# Patient Record
Sex: Male | Born: 1980
Health system: Southern US, Community
[De-identification: ages and names within clinical notes are randomized; demographics above are authoritative.]

## PROBLEM LIST (undated history)

## (undated) DIAGNOSIS — B20 Human immunodeficiency virus [HIV] disease: Secondary | ICD-10-CM

## (undated) DIAGNOSIS — T7840XA Allergy, unspecified, initial encounter: Secondary | ICD-10-CM

## (undated) HISTORY — DX: Allergy, unspecified, initial encounter: T78.40XA

## (undated) HISTORY — DX: Human immunodeficiency virus (HIV) disease: B20

---

## 2005-03-18 ENCOUNTER — Emergency Department (HOSPITAL_COMMUNITY): Admission: EM | Admit: 2005-03-18 | Discharge: 2005-03-18 | Payer: Self-pay | Admitting: Family Medicine

## 2006-02-06 ENCOUNTER — Ambulatory Visit: Payer: Self-pay | Admitting: Cardiology

## 2006-02-06 ENCOUNTER — Ambulatory Visit: Payer: Self-pay | Admitting: Internal Medicine

## 2006-02-26 ENCOUNTER — Ambulatory Visit: Payer: Self-pay | Admitting: Internal Medicine

## 2007-03-01 IMAGING — CT CT PELVIS W/ CM
2 of 5 series · 17 of 46 positions shown, 19 images · IV contrast (omnipaque)
Comparison: none

CLINICAL DATA: Abdominal pain, particularly left-sided, with some nausea and diarrhea.
 ABDOMEN CT WITH CONTRAST ? 02/06/06:
TECHNIQUE: Multidetector CT imaging of the abdomen was performed following the standard protocol during bolus administration of intravenous contrast. 
 Contrast:  125 cc Omnipaque 300 IV.   Oral contrast was given.
TECHNIQUE: Multidetector CT imaging of the pelvis was performed following the standard protocol during bolus administration of intravenous contrast.

[Series 2: abd_pel_xxl 5.0 b10f st · axial · 0.75mm/px · z∈[-440,-75]mm · 14 of 83 slices shown, 16 images]
[im 5/83  soft-tissue]
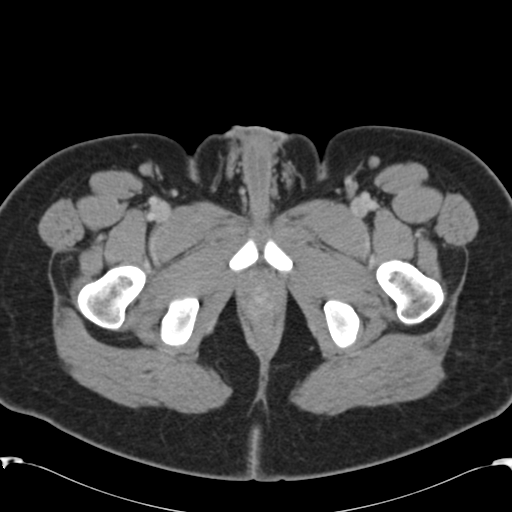
[im 5/83  bone]
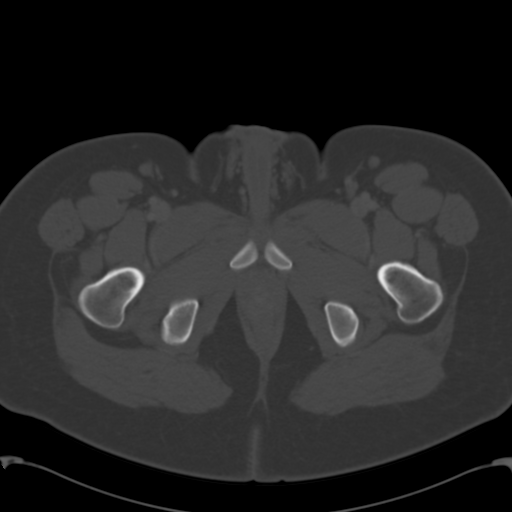
[im 9/83  soft-tissue]
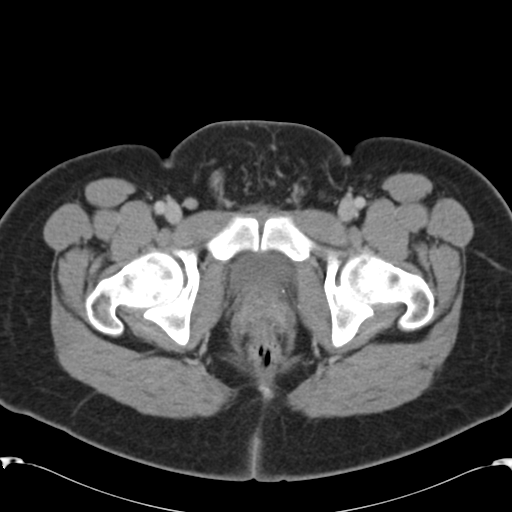
[im 18/83  soft-tissue]
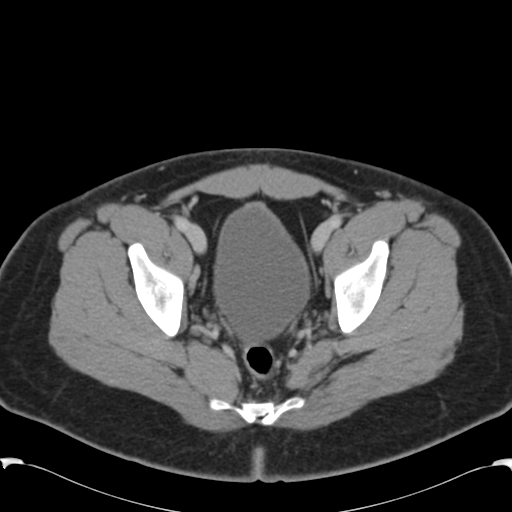
[im 22/83  soft-tissue]
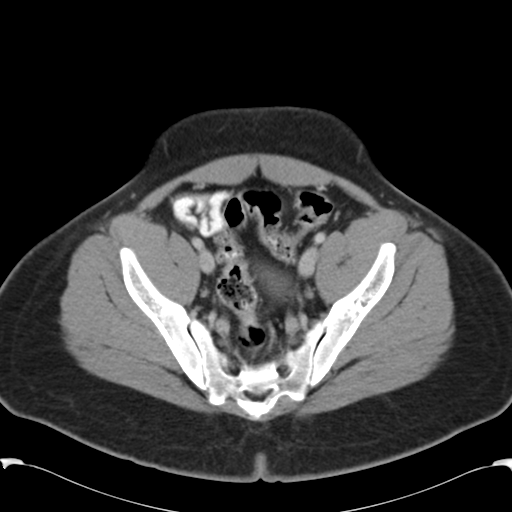
[im 26/83  soft-tissue]
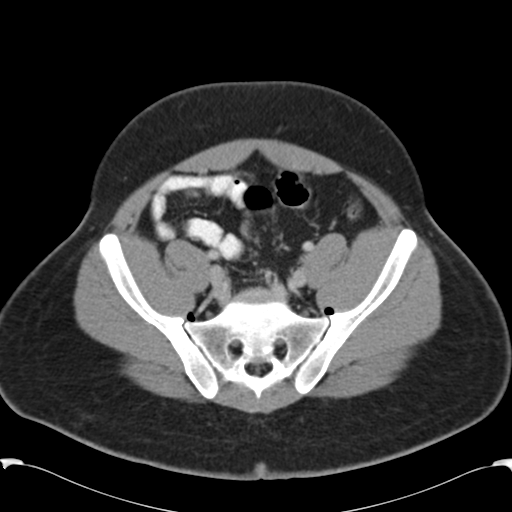
[im 35/83  soft-tissue]
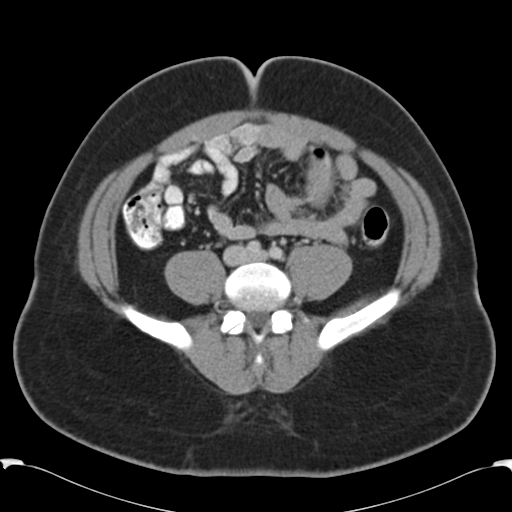
[im 39/83  soft-tissue]
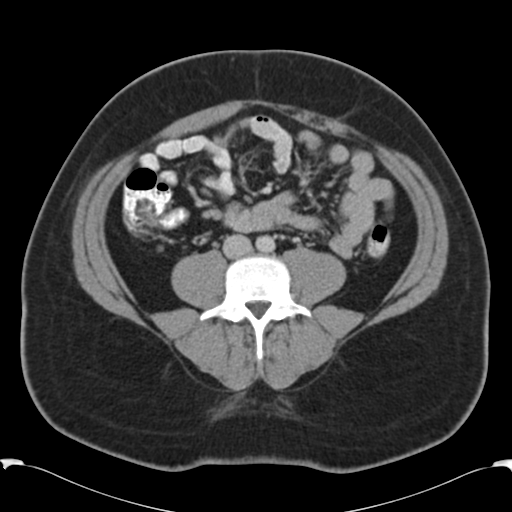
[im 44/83  soft-tissue]
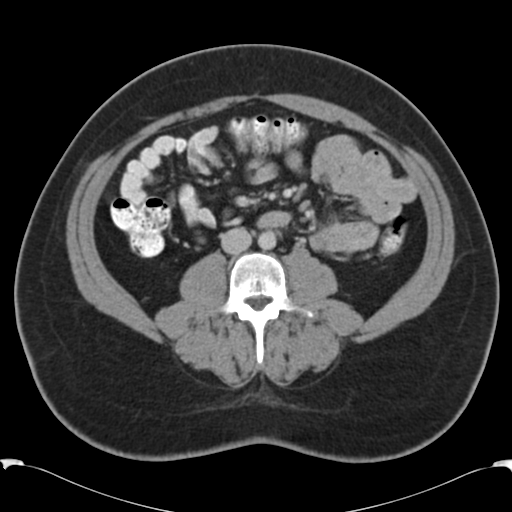
[im 48/83  soft-tissue]
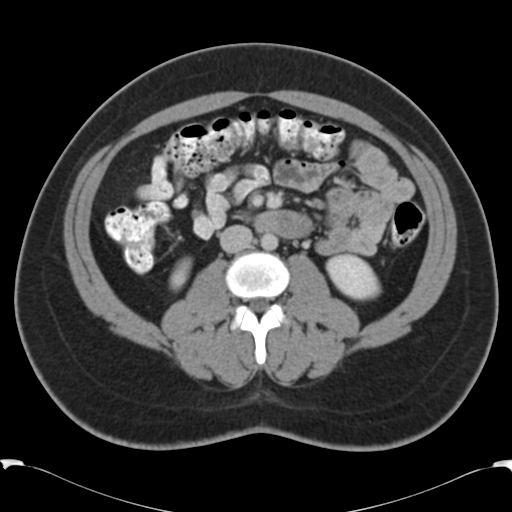
[im 48/83  bone]
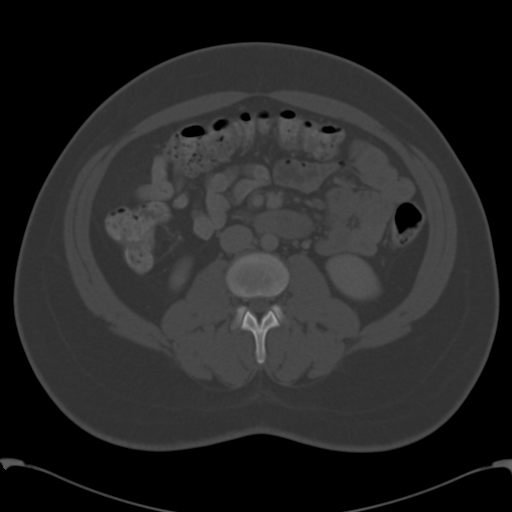
[im 57/83  soft-tissue]
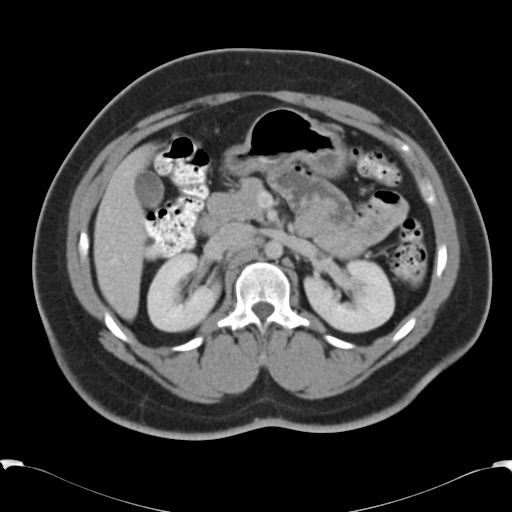
[im 61/83  soft-tissue]
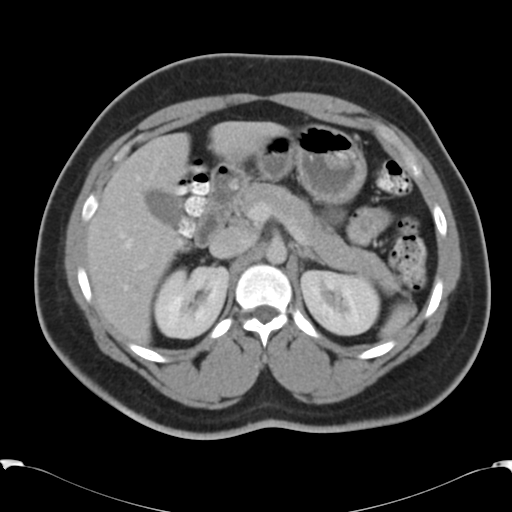
[im 65/83  soft-tissue]
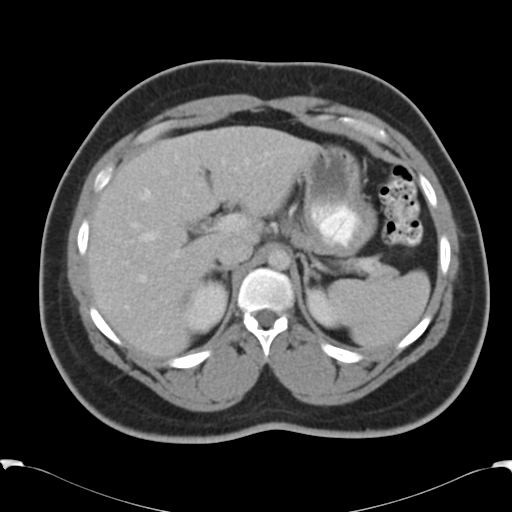
[im 74/83  soft-tissue]
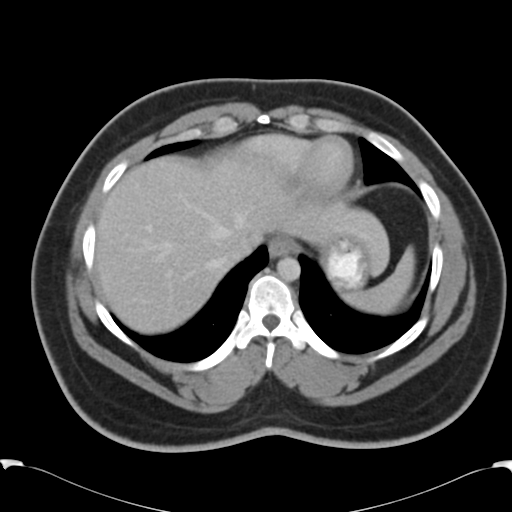
[im 78/83  soft-tissue]
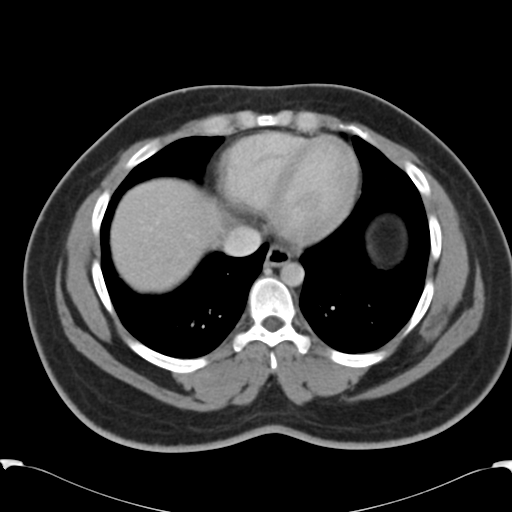

[Series 602: <mpr range> · coronal · 0.83mm/px · 3 of 39 slices shown]
[im 13/39  soft-tissue]
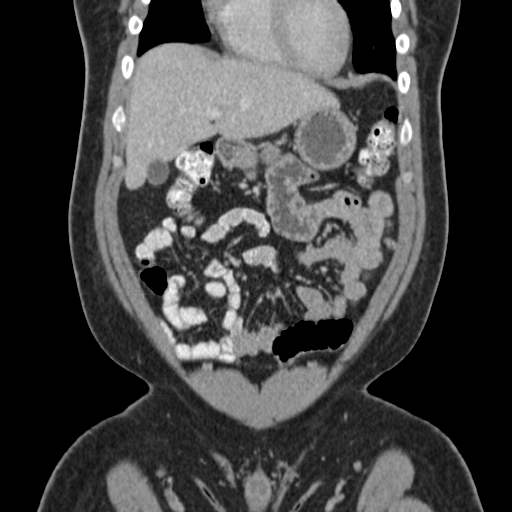
[im 17/39  soft-tissue]
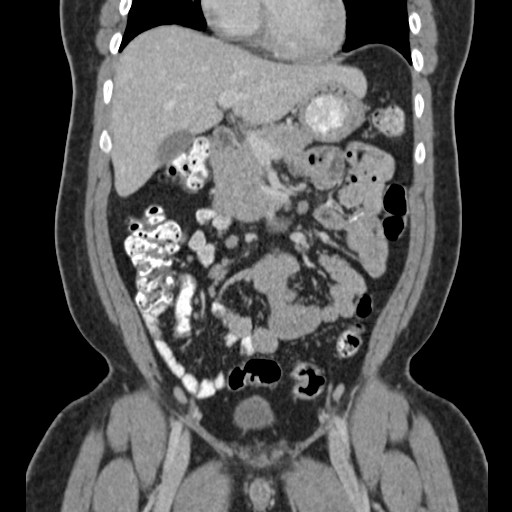
[im 22/39  soft-tissue]
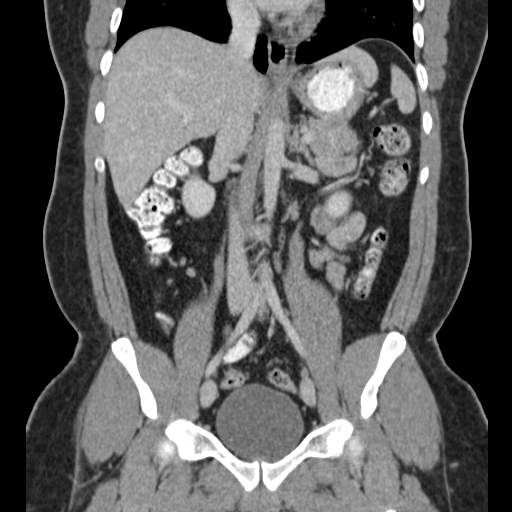

[17 of 46 positions shown; findings below may reference images not displayed]

FINDINGS: The lung bases are clear.  The liver enhances normally with no focal abnormality and no ductal dilatation is seen.  No calcified gallstones are noted.  The pancreas is normal in size and there is no evidence of pancreatic ductal dilatation.  The peripancreatic fat planes are well preserved.  The adrenal glands and spleen appear normal.  The kidneys enhance normally and on delayed images the pelvocaliceal systems appear normal.  The proximal ureters are normal in caliber.  Small mesenteric nodes are present.  There are a few slightly prominent nodes in the right lower quadrant. The largest of these nodes is on image #41 measuring 13 x 14 mm.  These nodes are nonspecific but mesenteric adenitis is a consideration.  The terminal ileum appears normal.
IMPRESSION: There are several prominent nodes in the right lower quadrant which are nonspecific.  Cannot exclude mesenteric adenitis.
 PELVIS CT WITH CONTRAST ? 02/06/06:
FINDINGS: Scans were continued through the pelvis after oral and IV contrast media were given.  The appendix is well seen and appears normal.  The urinary bladder is unremarkable.  No bony abnormality is seen.
IMPRESSION: Negative CT of the pelvis.  The appendix appears normal.

## 2008-11-21 ENCOUNTER — Emergency Department (HOSPITAL_COMMUNITY): Admission: EM | Admit: 2008-11-21 | Discharge: 2008-11-21 | Payer: Self-pay | Admitting: Family Medicine

## 2010-11-18 NOTE — Assessment & Plan Note (Signed)
[facility]                           GASTROENTEROLOGY OFFICE NOTE   SINAN, TUCH                   MRN:          [id number]  DATE:02/06/2006                            DOB:          16-Apr-1981    REFERRING PHYSICIAN:  Jonita Albee, MD   REASON FOR CONSULTATION:  Abdominal pain.   HISTORY:  This is a 30 year old African American male with no significant  past medical history who presents today regarding abdominal complaints.  The  patient reports a little over 2 months ago developing problems with left  upper quadrant discomfort.  His symptoms were exacerbated by certain meals  such as meat as well as stress.  He states his symptoms improved with  defecation or rest.  He has also had some indigestion, heartburn, and  nausea.  Stools have been somewhat loose with some mucus, no blood.  He  reports a decreased appetite and has lost 1-2 pounds.  [facility] evaluated at  [facility] and again in July.   LABORATORY:  Including CBC and comprehensive metabolic panel were  unremarkable.  His serum amylase was trivially elevated, though serum lipase  normal.  He was given 2 weeks of Nexium samples which he said significantly  helped his nausea as well as abdominal discomfort.  He is quite worried  about the cause of his discomfort.   PAST MEDICAL HISTORY:  None.   PAST SURGICAL HISTORY:  None.   FAMILY HISTORY:  Negative for gastrointestinal malignancy.   ALLERGIES:  CIPROFLOXACIN WHICH RESULTS IN NAUSEA AND VOMITING.   CURRENT MEDICATIONS:  1.  Multivitamin.  2.  Iron supplements.  3.  Motrin p.r.n.   SOCIAL HISTORY:  The patient is single, lives with his mother, a high school  graduate, works for a [employer], [employer] (consulting) as well as Science writer.  He  does not smoke.  He occasionally uses alcohol.   REVIEW OF SYSTEMS:  Per diagnostic evaluation form.   PHYSICAL EXAMINATION:  GENERAL:  Anxious but otherwise  well-appearing male  in no acute distress .  VITAL SIGNS:  Blood pressure 122/84.  Heart rate is 56 and regular.  Weight  is 218.8 pounds.  He is 5 feet 7 inches in height.  HEENT:  Sclerae are anicteric.  Conjunctivae are pink.  Oral mucosa is  intact.  No aphthous ulcerations.  LUNGS:  Clear.  HEART:  Regular.  ABDOMEN:  Obese and soft without tenderness, mass, or hernia.  Good bowel  sounds heard.  No significant tenderness in the left upper quadrant.  RECTAL:  Deferred.  EXTREMITIES:  Without edema.   IMPRESSION:  This is a 30 year old gentleman who presents with an 8-week  history of intermittent problems left sided abdominal discomfort associated  with this nausea, reflux symptoms, and mild change in bowel habits.  The  symptoms seemed to improve on proton pump inhibitor therapy suggesting acid-  peptic component.  As for his symptoms being exacerbated by stress and  certain meals, I wonder about irritable bowel.  He is certainly anxious  about the cause.   RECOMMENDATIONS:  1.  Resume  Nexium 40 mg daily.  Samples and a prescription with refills has      been provided.  2.  Levsin sublingual p.r.n. pain.  3.  Hemoccult cards.  4.  Schedule CT scan of the abdomen.  5.  Office followup in 6 weeks.  If CT unrevealing and symptoms persist      despite empiric treatments, then consider endoscopy and colonoscopy.                                   Wilhemina Bonito. Eda Keys., MD   JNP/MedQ  DD:  02/06/2006  DT:  02/06/2006  Job #:  [id number]   cc:   Jonita Albee, MD

## 2012-03-22 ENCOUNTER — Ambulatory Visit (INDEPENDENT_AMBULATORY_CARE_PROVIDER_SITE_OTHER): Payer: 59 | Admitting: Emergency Medicine

## 2012-03-22 ENCOUNTER — Other Ambulatory Visit: Payer: Self-pay | Admitting: Emergency Medicine

## 2012-03-22 VITALS — BP 120/76 | HR 86 | Temp 99.4°F | Resp 16 | Ht 66.0 in | Wt 230.8 lb

## 2012-03-22 DIAGNOSIS — L83 Acanthosis nigricans: Secondary | ICD-10-CM

## 2012-03-22 DIAGNOSIS — Z23 Encounter for immunization: Secondary | ICD-10-CM

## 2012-03-22 DIAGNOSIS — R110 Nausea: Secondary | ICD-10-CM

## 2012-03-22 DIAGNOSIS — Z113 Encounter for screening for infections with a predominantly sexual mode of transmission: Secondary | ICD-10-CM

## 2012-03-22 DIAGNOSIS — R5383 Other fatigue: Secondary | ICD-10-CM

## 2012-03-22 DIAGNOSIS — B20 Human immunodeficiency virus [HIV] disease: Secondary | ICD-10-CM

## 2012-03-22 LAB — POCT UA - MICROSCOPIC ONLY
Casts, Ur, LPF, POC: NEGATIVE
Yeast, UA: NEGATIVE

## 2012-03-22 LAB — POCT CBC
Granulocyte percent: 60.5 %G (ref 37–80)
Hemoglobin: 15.2 g/dL (ref 14.1–18.1)
MCV: 92.6 fL (ref 80–97)
MID (cbc): 0.4 (ref 0–0.9)
Platelet Count, POC: 179 10*3/uL (ref 142–424)
RBC: 5.22 M/uL (ref 4.69–6.13)

## 2012-03-22 LAB — POCT URINALYSIS DIPSTICK
Glucose, UA: NEGATIVE
Leukocytes, UA: NEGATIVE
Urobilinogen, UA: 1

## 2012-03-22 NOTE — Progress Notes (Signed)
Subjective:    Patient ID: Keith Vasquez, male    DOB: [birth year], 31 y.o.   MRN: [id number]  HPI 31 yr old AAM HIV + presents with a 1 week h/o not feeling well.  +fatigue.  +nausea w/o vomiting. Denies dysuria or frequency.  No cough.  No hemoptysis.  No night sweats.  No unexplained weight loss. Last CD4 counts 740 and viral load undetectable in March 2013.  He wants to do STD screening as well, but he doesn't have any symptoms  History of gonorrhea and syphilis treated.  Moving a chair about 2 weeks ago and it cut his R middle finger.  He is due for a tetanus. Thinks last one was 2002.  Review of Systems  All other systems reviewed and are negative.       Objective:   Physical Exam  Nursing note and vitals reviewed. Constitutional: He is oriented to person, place, and time. He appears well-developed and well-nourished.  HENT:  Head: Normocephalic and atraumatic.  Right Ear: External ear normal.  Left Ear: External ear normal.  Mouth/Throat: No oropharyngeal exudate (PND without erythema).       Turbinates pale and enlarged.  Neck: Normal range of motion. Neck supple. No thyromegaly present.  Cardiovascular: Normal rate, regular rhythm and normal heart sounds.   Pulmonary/Chest: Effort normal and breath sounds normal.  Lymphadenopathy:       Head (right side): No submental, no tonsillar, no preauricular, no posterior auricular and no occipital adenopathy present.       Head (left side): No submental, no tonsillar, no preauricular, no posterior auricular and no occipital adenopathy present.    He has no cervical adenopathy.    He has no axillary adenopathy.       Right: No supraclavicular adenopathy present.       Left: No supraclavicular adenopathy present.  Neurological: He is alert and oriented to person, place, and time.  Skin: Skin is warm and dry.  Psychiatric: He has a normal mood and affect. His behavior is normal. Thought content normal.   Results for orders  placed in visit on 03/22/12  POCT CBC      Component Value Range   WBC 5.8  4.6 - 10.2 K/uL   Lymph, poc 1.9  0.6 - 3.4   POC LYMPH PERCENT 33.1  10 - 50 %L   MID (cbc) 0.4  0 - 0.9   POC MID % 6.4  0 - 12 %M   POC Granulocyte 3.5  2 - 6.9   Granulocyte percent 60.5  37 - 80 %G   RBC 5.22  4.69 - 6.13 M/uL   Hemoglobin 15.2  14.1 - 18.1 g/dL   HCT, POC 78.2  95.6 - 53.7 %   MCV 92.6  80 - 97 fL   MCH, POC 29.1  27 - 31.2 pg   MCHC 31.5 (*) 31.8 - 35.4 g/dL   RDW, POC 21.3     Platelet Count, POC 179  142 - 424 K/uL   MPV 10.3  0 - 99.8 fL  GLUCOSE, POCT (MANUAL RESULT ENTRY)      Component Value Range   POC Glucose 94  70 - 99 mg/dl  POCT URINALYSIS DIPSTICK      Component Value Range   Color, UA amber     Clarity, UA clear     Glucose, UA neg     Bilirubin, UA small     Ketones, UA 15  Spec Grav, UA 1.025     Blood, UA neg     pH, UA 6.5     Protein, UA 30     Urobilinogen, UA 1.0     Nitrite, UA neg     Leukocytes, UA Negative         Assessment & Plan:  HIV+-will check CD4 count.  I doubt this has changed.  He certainly doesn't have any findings (or even symptoms) that would make me think he is converting to AIDS; and had a normal CD4 in March, 2013. Fatigue-checking labs STD screening-checking labs Will update Tdap due to small wound he got 2 weeks ago when moving a chair. Discussed case with Dr.Daub. Patient instructed to increase water intake to 60-80 ounces per day and recheck urine in 2 weeks.

## 2012-03-23 LAB — GC/CHLAMYDIA PROBE AMP, URINE: Chlamydia, Swab/Urine, PCR: NEGATIVE

## 2012-03-25 LAB — T-HELPER CELLS (CD4) COUNT (NOT AT ARMC)
Total Lymphocyte: 28 % (ref 12–46)
Total lymphocyte count: 1568 /uL (ref 700–3300)

## 2012-03-25 NOTE — Patient Instructions (Signed)
Increase water intake to 60-80 ounces/day and recheck urine in 2 weeks.

## 2012-03-26 ENCOUNTER — Ambulatory Visit: Payer: Self-pay | Admitting: Physician Assistant

## 2012-04-26 ENCOUNTER — Ambulatory Visit (INDEPENDENT_AMBULATORY_CARE_PROVIDER_SITE_OTHER): Payer: 59 | Admitting: Physician Assistant

## 2012-04-26 VITALS — BP 118/78 | HR 83 | Temp 98.1°F | Resp 20 | Ht 66.0 in | Wt 229.0 lb

## 2012-04-26 DIAGNOSIS — R369 Urethral discharge, unspecified: Secondary | ICD-10-CM

## 2012-04-26 LAB — POCT CBC
MCH, POC: 28.5 pg (ref 27–31.2)
MID (cbc): 0.3 (ref 0–0.9)
MPV: 10.3 fL (ref 0–99.8)
POC MID %: 5.9 %M (ref 0–12)
Platelet Count, POC: 176 10*3/uL (ref 142–424)
RBC: 5.06 M/uL (ref 4.69–6.13)
RDW, POC: 14.8 %
WBC: 5.4 10*3/uL (ref 4.6–10.2)

## 2012-04-26 LAB — POCT UA - MICROSCOPIC ONLY
Crystals, Ur, HPF, POC: NEGATIVE
Yeast, UA: NEGATIVE

## 2012-04-26 LAB — POCT URINALYSIS DIPSTICK
Ketones, UA: NEGATIVE
Leukocytes, UA: NEGATIVE
Nitrite, UA: NEGATIVE
Protein, UA: NEGATIVE
pH, UA: 7

## 2012-04-26 MED ORDER — AZITHROMYCIN 250 MG PO TABS: ORAL_TABLET | ORAL | Status: DC

## 2012-04-26 MED ORDER — CEFTRIAXONE SODIUM 1 G IJ SOLR
250.0000 mg | Freq: Once | INTRAMUSCULAR | Status: AC
  Administered 2012-04-26: 250 mg via INTRAMUSCULAR

## 2012-04-26 NOTE — Progress Notes (Signed)
Subjective:    Patient ID: Keith Vasquez, male    DOB: Mar 03, 1981, 31 y.o.   MRN: [id number]  HPI This 31 y.o. male presents for evaluation of penile discharge.  He reports clear penile discharge for approximately 4 weeks.  His symptoms began after an encounter with a new partner where there was no orifice penetration, but "grinding on my penis."  He denies urinary urgency, frequency and burning.  He is concerned that his very frequent masturbation may be causing the symptoms.  He has not "had sex" with a partner in over a year, in part because of his HIV infection.  His last visit with ID at [facility] was within the last month.  He reports his CD4 was >500 and viral load was undetectable.    In addition to denying urinary symptoms, he has not noted any lesions on the penis or genital area, fever, chills, GI symptoms; no swollen or tender nodes in the groin.  Review of Systems As above.   Past Medical History  Diagnosis Date  . Allergy   . HIV infection     History reviewed. No pertinent past surgical history.  Prior to Admission medications   Medication Sig Start Date End Date Taking? Authorizing Provider  emtricitabine-tenofovir (TRUVADA) 200-300 MG per tablet Take 1 tablet by mouth daily.   Yes Historical Provider, MD  raltegravir (ISENTRESS) 400 MG tablet Take 400 mg by mouth 2 (two) times daily.   Yes Historical Provider, MD    No Known Allergies  History   Social History  . Marital Status: Single    Spouse Name: n/a    Number of Children: 0  . Years of Education: 12   Occupational History  . Security [employer]   Social History Main Topics  . Smoking status: some smoking Current Some Day Smoker    Types: Black & Milds Cigarettes  . Smokeless tobacco: no Not on file   Comment: plans to quit  . Alcohol Use: Yes     occasionally  . Drug Use: none No  . Sexually Active: yes Yes -- Male partner(s)    Birth Control/ Protection: sex with men;  inconsistent condom use Condom   Other Topics Concern  . Not on file   Social History Narrative   Lives alone.    Family History  Problem Relation Age of Onset  . Hypertension Mother   . Hypertension Father        Objective:   Physical Exam BP 118/78  Pulse 83  Temp 98.1 F (36.7 C) (Oral)  Resp 20  Ht 5\' 6"  (1.676 m)  Wt 229 lb (103.874 kg)  BMI 36.96 kg/m2  SpO2 99% WDWNBM, A&Ox3.  [place]/AT. Sclera and conjunctiva are clear.  Normal respiratory effort.  Skin is warm and dry.  He is mildly anxious (his baseline) but mood and affect and behavior are appropriate.    Results for orders placed in visit on 04/26/12  POCT UA - MICROSCOPIC ONLY      Component Value Range   WBC, Ur, HPF, POC 1-3     RBC, urine, microscopic 0-2     Bacteria, U Microscopic small     Mucus, UA small     Epithelial cells, urine per micros 1-3     Crystals, Ur, HPF, POC neg     Casts, Ur, LPF, POC neg     Yeast, UA neg    POCT URINALYSIS DIPSTICK      Component  Value Range   Color, UA yellow     Clarity, UA clear     Glucose, UA neg     Bilirubin, UA neg     Ketones, UA neg     Spec Grav, UA 1.025     Blood, UA trace     pH, UA 7.0     Protein, UA neg     Urobilinogen, UA 0.2     Nitrite, UA neg     Leukocytes, UA Negative    GLUCOSE, POCT (MANUAL RESULT ENTRY)      Component Value Range   POC Glucose 84  70 - 99 mg/dl  POCT CBC      Component Value Range   WBC 5.4  4.6 - 10.2 K/uL   Lymph, poc 2.7  0.6 - 3.4   POC LYMPH PERCENT 49.8  10 - 50 %L   MID (cbc) 0.3  0 - 0.9   POC MID % 5.9  0 - 12 %M   POC Granulocyte 2.4  2 - 6.9   Granulocyte percent 44.3  37 - 80 %G   RBC 5.06  4.69 - 6.13 M/uL   Hemoglobin 14.4  14.1 - 18.1 g/dL   HCT, POC 65.7  84.6 - 53.7 %   MCV 92.6  80 - 97 fL   MCH, POC 28.5  27 - 31.2 pg   MCHC 30.7 (*) 31.8 - 35.4 g/dL   RDW, POC 96.2     Platelet Count, POC 176  142 - 424 K/uL   MPV 10.3  0 - 99.8 fL       Assessment & Plan:   1. Penile  discharge  POCT UA - Microscopic Only, POCT urinalysis dipstick, GC/chlamydia probe amp, urine, POCT glucose (manual entry), POCT CBC, Urine culture, cefTRIAXone (ROCEPHIN) injection 250 mg, azithromycin (ZITHROMAX) 250 MG tablet   He'll hold the Rx for azithromycin until we receive the CT results, though we elect to cover for GC today and prevent the need to RTC if the test is positive.  He's reminded of the need to use condoms consistently.

## 2012-04-26 NOTE — Patient Instructions (Signed)
Hold the prescription for azithromycin until you hear from me regarding the test results.  If you test positive for chlamydia, you'll need to fill the prescription and take the medication.  I suspect, however, that you do NOT have an STD.

## 2013-07-14 ENCOUNTER — Ambulatory Visit (INDEPENDENT_AMBULATORY_CARE_PROVIDER_SITE_OTHER): Payer: 59 | Admitting: Family Medicine

## 2013-07-14 VITALS — BP 122/78 | HR 90 | Temp 98.4°F | Resp 18 | Ht 67.0 in | Wt 240.4 lb

## 2013-07-14 DIAGNOSIS — R079 Chest pain, unspecified: Secondary | ICD-10-CM

## 2013-07-14 DIAGNOSIS — B20 Human immunodeficiency virus [HIV] disease: Secondary | ICD-10-CM

## 2013-07-14 MED ORDER — OMEPRAZOLE 40 MG PO CPDR: 40.0000 mg | DELAYED_RELEASE_CAPSULE | Freq: Every day | ORAL | Status: DC

## 2013-07-14 NOTE — Progress Notes (Signed)
I have examined this patient along with the student and agree.  

## 2013-07-14 NOTE — Progress Notes (Signed)
Patient was seen and evaluated by a physician assistant, Theora Gianottihelle Jeffrey PAC , and a PA student, Janeece AgeeMary Stuckley. The patient was discussed with me. I reviewed the EKG and it is normal. Discussed with the assistants and have reviewed plan of treatment. Agree with plans.

## 2013-07-14 NOTE — Patient Instructions (Signed)
Start an exercise regimen - begin with 30 minutes of walking 5 days per week and increase intensity or length of exercise over time. Will help to improve stress.  Drink 64 ounces of water per day.  The omeprazole is for the acid reflux which is the cause of your indigestion and is likely the cause of your chest pain. May take 4 weeks for symptom resolution.  If chest pain worsens go to the ER.

## 2013-07-14 NOTE — Progress Notes (Signed)
   Subjective:    Patient ID: Keith Vasquez, male    DOB: 03-05-1981, 33 y.o.   MRN: [id number]  Reviewed medications, past medical history, social history and family history.  HPI  Reports dull chest pain at top of chest x 5 days. Describes "light" pain when he wakes up in the morning which progresses throughout the day.  Does not notice pain as much at night. No problems sleeping and not waking up throughout the night.  Second-hand smoke makes pain worse. Food and lying down makes pain better. However, certain foods (hardy) give him indigestion which feels like his current chest pain.  "Nerves have been bad the past few months" and he is feeling anxious.  Has been very stressed taking care of grandfather who passed away 06/14/13. Doesn't think he is worried about anything now but feels stressed.  No palpitations, SOB, changes in vision, swelling, fever, chills, night sweats, changes in bowel movements or urinary symptoms.  Reports headaches every other day. Takes ibuprofen which seems to help.  Blood pressures at home of 158/111 on 07/10/2013.     Review of Systems As above.     Objective:   Physical Exam  Constitutional: He is oriented to person, place, and time. Vital signs are normal. He appears well-developed and well-nourished.  Cardiovascular: Normal rate and regular rhythm.   Pulmonary/Chest: Effort normal and breath sounds normal.  Upper chest tender to palpation.    Neurological: He is alert and oriented to person, place, and time.  Psychiatric: His behavior is normal. Thought content normal. His mood appears anxious.    EKG showed NSR. Reviewed with Dr. Alwyn RenHopper.       Assessment & Plan:   1. Chest pain Chest pain most likely due to acid reflux, exacerbated by recent stress.Patient does not want to take any medications that he might become dependent on so recommended stress relief through exercise.  - EKG 12-Lead - omeprazole (PRILOSEC) 40 MG capsule; Take 1  capsule (40 mg total) by mouth daily.  Dispense: 30 capsule; Refill: 3  Patient Information: Start workout regimen, drink 64 oz water per day, omeprazole will take 4 weeks for symptoms relief, go to ER if chest pain worsens.  .Marland Kitchen

## 2013-08-12 ENCOUNTER — Ambulatory Visit (INDEPENDENT_AMBULATORY_CARE_PROVIDER_SITE_OTHER): Payer: 59 | Admitting: Emergency Medicine

## 2013-08-12 VITALS — BP 140/90 | HR 80 | Temp 97.8°F | Resp 16 | Ht 65.5 in | Wt 243.0 lb

## 2013-08-12 DIAGNOSIS — J209 Acute bronchitis, unspecified: Secondary | ICD-10-CM

## 2013-08-12 DIAGNOSIS — J018 Other acute sinusitis: Secondary | ICD-10-CM

## 2013-08-12 MED ORDER — AMOXICILLIN-POT CLAVULANATE 875-125 MG PO TABS: 1.0000 | ORAL_TABLET | Freq: Two times a day (BID) | ORAL | Status: DC

## 2013-08-12 MED ORDER — GUAIFENESIN-DM 100-10 MG/5ML PO SYRP: 5.0000 mL | ORAL_SOLUTION | ORAL | Status: DC | PRN

## 2013-08-12 NOTE — Patient Instructions (Signed)

## 2013-08-12 NOTE — Progress Notes (Signed)
[facility] [address], [place] [address] [phone]  Date:  08/12/2013   Name:  Keith Vasquez   DOB:  Sep 28, 1980   MRN:  [id number]  PCP:  No primary provider on file.    Chief Complaint: Cough, Ear Fullness and Wheezing   History of Present Illness:  Keith Vasquez is a 33 y.o. very pleasant male patient who presents with the following:  Ill with nasal congestion and drainage, purulent in character.  Now has post nasal drainage and an intermittent cough.  Productive.  No fever or chills.  Some wheezing but no shortness of breath.  No nausea or vomiting. No improvement with over the counter medications or other home remedies. Denies other complaint or health concern today.   Patient Active Problem List   Diagnosis Date Noted  . HIV (human immunodeficiency virus infection) 12/12/2007    Past Medical History  Diagnosis Date  . Allergy   . HIV infection     History reviewed. No pertinent past surgical history.  History  Substance Use Topics  . Smoking status: Former Smoker    Types: Cigarettes    Quit date: 07/14/2008  . Smokeless tobacco: Never Used     Comment: plans to quit  . Alcohol Use: 0.0 oz/week    4-6 Shots of liquor per week     Comment: occasionally    Family History  Problem Relation Age of Onset  . Hypertension Mother   . Hypertension Father     No Known Allergies  Medication list has been reviewed and updated.  Current Outpatient Prescriptions on File Prior to Visit  Medication Sig Dispense Refill  . omeprazole (PRILOSEC) 40 MG capsule Take 1 capsule (40 mg total) by mouth daily.  30 capsule  3  . raltegravir (ISENTRESS) 400 MG tablet Take 400 mg by mouth 2 (two) times daily.       No current facility-administered medications on file prior to visit.    Review of Systems:  As per HPI, otherwise negative.    Physical Examination: Filed Vitals:   08/12/13 0834  BP: 140/90  Pulse: 80  Temp: 97.8 F  (36.6 C)  Resp: 16   Filed Vitals:   08/12/13 0834  Height: 5' 5.5" (1.664 m)  Weight: 243 lb (110.224 kg)   Body mass index is 39.81 kg/(m^2). Ideal Body Weight: Weight in (lb) to have BMI = 25: 152.2  GEN: obese, NAD, Non-toxic, A & O x 3 HEENT: Atraumatic, Normocephalic. Neck supple. No masses, No LAD. Ears and Nose: No external deformity.  Purulent nasal drainage CV: RRR, No M/G/R. No JVD. No thrill. No extra heart sounds. PULM: CTA B, no wheezes, crackles, rhonchi. No retractions. No resp. distress. No accessory muscle use. ABD: S, NT, ND, +BS. No rebound. No HSM. EXTR: No c/c/e NEURO Normal gait.  PSYCH: Normally interactive. Conversant. Not depressed or anxious appearing.  Calm demeanor.    Assessment and Plan: Sinusitis Bronchitis with bronchospasm augmentin Robitussin dm   Signed,  Phillips OdorJeffery Blythe Hartshorn, MD

## 2013-09-04 ENCOUNTER — Telehealth: Payer: Self-pay

## 2013-09-04 NOTE — Telephone Encounter (Signed)
Congestion, still experiencing some sob.  i advised him to RTC to ensure this hasnt progressed and the medication has worked, sent him to [phone] for appointment.

## 2013-09-04 NOTE — Telephone Encounter (Signed)
Chelle, Pt states that he is still having a hard time catching his breath and is still experiencing congestion, pt would like to speak with you regarding this.  Best# [phone] (work) Or [phone]

## 2013-12-30 ENCOUNTER — Ambulatory Visit: Payer: 59

## 2013-12-31 ENCOUNTER — Ambulatory Visit (INDEPENDENT_AMBULATORY_CARE_PROVIDER_SITE_OTHER): Payer: 59 | Admitting: Emergency Medicine

## 2013-12-31 VITALS — BP 120/84 | HR 83 | Temp 98.0°F | Resp 20 | Ht 66.0 in | Wt 250.0 lb

## 2013-12-31 DIAGNOSIS — B20 Human immunodeficiency virus [HIV] disease: Secondary | ICD-10-CM

## 2013-12-31 DIAGNOSIS — Z202 Contact with and (suspected) exposure to infections with a predominantly sexual mode of transmission: Secondary | ICD-10-CM

## 2013-12-31 DIAGNOSIS — J039 Acute tonsillitis, unspecified: Secondary | ICD-10-CM

## 2013-12-31 DIAGNOSIS — R369 Urethral discharge, unspecified: Secondary | ICD-10-CM

## 2013-12-31 DIAGNOSIS — N342 Other urethritis: Secondary | ICD-10-CM

## 2013-12-31 LAB — POCT RAPID STREP A (OFFICE): RAPID STREP A SCREEN: NEGATIVE

## 2013-12-31 MED ORDER — AZITHROMYCIN 250 MG PO TABS: ORAL_TABLET | ORAL | Status: DC

## 2013-12-31 MED ORDER — CEFTRIAXONE SODIUM 1 G IJ SOLR
250.0000 mg | Freq: Once | INTRAMUSCULAR | Status: AC
  Administered 2013-12-31: 250 mg via INTRAMUSCULAR

## 2013-12-31 NOTE — Progress Notes (Signed)
   Subjective:    Patient ID: Keith Vasquez, male    DOB: October 05, 1980, 33 y.o.   MRN: [id number]  HPI patient enters requesting a CD4 count. [facility] He states he was seen there a month ago. He is also in because he is concerned about possible STD of his throat. He states his throat is sore and irritated he. Looking through the chart he has had previous Chlamydia infection. He denies having a urethral discharge the    Review of Systems     Objective:   Physical Exam physical exam reveals an alert gentleman who is not in any distress. Neck is supple. Tonsils are 2+ enlarged and red. Chest clear heart regular rate no murmurs abdomen soft nontender examination of the genital urinary system reveals a whitish yellow discharge.  Results for orders placed in visit on 12/31/13  POCT RAPID STREP A (OFFICE)      Result Value Ref Range   Rapid Strep A Screen Negative  Negative        Meds ordered this encounter  Medications  . cefTRIAXone (ROCEPHIN) injection 250 mg    Sig:     Order Specific Question:  Antibiotic Indication:    Answer:  STD  . azithromycin (ZITHROMAX) 250 MG tablet    Sig: Take 4 tablets 1  dose    Dispense:  4 each    Refill:  0   Assessment & Plan:  Patient is high risk for an STD of his throat. He also has physical exam findings of a urethra this. We'll go ahead and treat with 250 of Rocephin and Zithromax 1 g by mouth. URiprobe was done as well as throat culture for gonorrhea

## 2014-01-02 LAB — T-HELPER CELLS (CD4) COUNT (NOT AT ARMC)
ABSOLUTE CD4: 1001 /uL (ref 381–1469)
CD4 T HELPER %: 29 % — AB (ref 32–62)
Total Lymphocyte: 46 % (ref 12–46)
Total lymphocyte count: 3450 /uL — ABNORMAL HIGH (ref 700–3300)
WBC, LYMPH ENUMERATION: 7.5 10*3/uL (ref 4.0–10.5)

## 2014-01-02 LAB — GC/CHLAMYDIA PROBE AMP
CT PROBE, AMP APTIMA: NEGATIVE
GC PROBE AMP APTIMA: POSITIVE — AB

## 2014-01-05 ENCOUNTER — Telehealth: Payer: Self-pay

## 2014-01-05 NOTE — Telephone Encounter (Signed)
Opened in error

## 2014-01-06 LAB — GONOCOCCUS CULTURE

## 2019-06-12 ENCOUNTER — Encounter (HOSPITAL_COMMUNITY): Payer: Self-pay | Admitting: Emergency Medicine

## 2019-06-12 ENCOUNTER — Other Ambulatory Visit: Payer: Self-pay

## 2019-06-12 ENCOUNTER — Ambulatory Visit (HOSPITAL_COMMUNITY)
Admission: EM | Admit: 2019-06-12 | Discharge: 2019-06-12 | Disposition: A | Discharge status: Home / Self Care | Payer: 59 | Attending: Internal Medicine | Admitting: Internal Medicine

## 2019-06-12 DIAGNOSIS — R0981 Nasal congestion: Secondary | ICD-10-CM

## 2019-06-12 DIAGNOSIS — R5381 Other malaise: Secondary | ICD-10-CM

## 2019-06-12 DIAGNOSIS — R5383 Other fatigue: Secondary | ICD-10-CM

## 2019-06-12 DIAGNOSIS — R059 Cough, unspecified: Secondary | ICD-10-CM

## 2019-06-12 DIAGNOSIS — U071 COVID-19: Secondary | ICD-10-CM | POA: Diagnosis not present

## 2019-06-12 DIAGNOSIS — B20 Human immunodeficiency virus [HIV] disease: Secondary | ICD-10-CM

## 2019-06-12 DIAGNOSIS — R52 Pain, unspecified: Secondary | ICD-10-CM

## 2019-06-12 DIAGNOSIS — R05 Cough: Secondary | ICD-10-CM

## 2019-06-12 LAB — POC SARS CORONAVIRUS 2 AG: SARS Coronavirus 2 Ag: POSITIVE — AB

## 2019-06-12 LAB — POC SARS CORONAVIRUS 2 AG -  ED: SARS Coronavirus 2 Ag: POSITIVE — AB

## 2019-06-12 MED ORDER — ACETAMINOPHEN 325 MG PO TABS
650.0000 mg | ORAL_TABLET | Freq: Once | ORAL | Status: AC
  Administered 2019-06-12: 650 mg via ORAL

## 2019-06-12 MED ORDER — ACETAMINOPHEN 325 MG PO TABS
ORAL_TABLET | ORAL | Status: AC
  Filled 2019-06-12: qty 2

## 2019-06-12 MED ORDER — BENZONATATE 100 MG PO CAPS: 100.0000 mg | ORAL_CAPSULE | Freq: Three times a day (TID) | ORAL | 0 refills | Status: DC | PRN

## 2019-06-12 MED ORDER — PROMETHAZINE-DM 6.25-15 MG/5ML PO SYRP: 5.0000 mL | ORAL_SOLUTION | Freq: Every evening | ORAL | 0 refills | Status: DC | PRN

## 2019-06-12 NOTE — ED Notes (Signed)
Spoke to dr hagler about this patient having a poc covid test, test in lab, megan, rad tech notified

## 2019-06-12 NOTE — ED Triage Notes (Addendum)
Symptoms started Monday.  Patient has a lot of congestion.   Denies a fever Patient has chills Intermittent sweats  Loss of sense of smell and taste today

## 2019-06-12 NOTE — Discharge Instructions (Addendum)
We will manage this as a viral syndrome. For sore throat or cough try using a honey-based tea. Use 3 teaspoons of honey with juice squeezed from half lemon. Place shaved pieces of ginger into 1/2-1 cup of water and warm over stove top. Then mix the ingredients and repeat every 4 hours as needed. Please take Tylenol 500mg every 6 hours. Hydrate very well with at least 2 liters of water. Eat light meals such as soups to replenish electrolytes and soft fruits, veggies. Start an antihistamine like Zyrtec, Allegra or Claritin for postnasal drainage, sinus congestion.  You can take this together with pseudoephedrine (Sudafed) at a dose of 60 mg 3 times a day or twice daily as needed for the same kind of congestion.   ° ° °

## 2019-06-12 NOTE — ED Provider Notes (Signed)
[place]   MRN: [id number] DOB: [birth year]  Subjective:   Keith Vasquez is a 38 y.o. male presenting for 3 to 4-day history of acute onset mild to moderate sinus congestion, chills and intermittent sweats.  Today, patient became concerned because he lost his sense of smell and taste.  Patient works as a Animal nutritionist for [employer].  He has a history of HIV infection, is compliant with his medications.  Denies any direct Covid contacts without wearing PPE.   Current Facility-Administered Medications:  .  acetaminophen (TYLENOL) tablet 650 mg, 650 mg, Oral, Once, Lamptey, Myrene Galas, MD  Current Outpatient Medications:  .  azithromycin (ZITHROMAX) 250 MG tablet, Take 4 tablets 1  dose, Disp: 4 each, Rfl: 0 .  emtricitabine-tenofovir (TRUVADA) 200-300 MG per tablet, Take 1 tablet by mouth daily., Disp: , Rfl:  .  raltegravir (ISENTRESS) 400 MG tablet, Take 400 mg by mouth 2 (two) times daily., Disp: , Rfl:    Allergies  Allergen Reactions  . Ciprofloxacin Nausea And Vomiting    Past Medical History:  Diagnosis Date  . Allergy   . HIV infection ([place])      History reviewed. No pertinent surgical history.  Family History  Problem Relation Age of Onset  . Hypertension Mother   . Hypertension Father     Social History   Tobacco Use  . Smoking status: Never Smoker  . Smokeless tobacco: Never Used  Substance Use Topics  . Alcohol use: Yes    Alcohol/week: 4.0 - 6.0 standard drinks    Types: 4 - 6 Shots of liquor per week    Comment: occasionally  . Drug use: No    Review of Systems  Constitutional: Positive for malaise/fatigue. Negative for fever.  HENT: Positive for congestion. Negative for ear pain, sinus pain and sore throat.   Eyes: Negative for discharge and redness.  Respiratory: Positive for cough and shortness of breath. Negative for hemoptysis and wheezing.   Cardiovascular: Negative for chest pain.  Gastrointestinal: Negative for  abdominal pain, diarrhea, nausea and vomiting.  Genitourinary: Negative for dysuria, flank pain and hematuria.  Musculoskeletal: Positive for myalgias.  Skin: Negative for rash.  Neurological: Negative for dizziness, weakness and headaches.  Psychiatric/Behavioral: Negative for depression and substance abuse.     Objective:   Vitals: BP 126/75 (BP Location: Left Arm) Comment (BP Location): large cuff  Pulse 96   Temp (!) 101 F (38.3 C) (Oral)   Resp (!) 32   SpO2 97%   Physical Exam Constitutional:      General: He is not in acute distress.    Appearance: Normal appearance. He is well-developed and normal weight. He is not ill-appearing, toxic-appearing or diaphoretic.  HENT:     Head: Normocephalic and atraumatic.     Right Ear: Tympanic membrane, ear canal and external ear normal. There is no impacted cerumen.     Left Ear: Tympanic membrane, ear canal and external ear normal. There is no impacted cerumen.     Nose: Nose normal. No congestion or rhinorrhea.     Mouth/Throat:     Mouth: Mucous membranes are moist.     Pharynx: Oropharynx is clear. No oropharyngeal exudate or posterior oropharyngeal erythema.  Eyes:     General: No scleral icterus.       Right eye: No discharge.        Left eye: No discharge.     Extraocular Movements: Extraocular movements intact.  Conjunctiva/sclera: Conjunctivae normal.     Pupils: Pupils are equal, round, and reactive to light.  Cardiovascular:     Rate and Rhythm: Normal rate and regular rhythm.     Heart sounds: Normal heart sounds. No murmur. No friction rub. No gallop.   Pulmonary:     Effort: Pulmonary effort is normal. No respiratory distress.     Breath sounds: Normal breath sounds. No stridor. No wheezing, rhonchi or rales.  Musculoskeletal:     Cervical back: Normal range of motion and neck supple. No rigidity. No muscular tenderness.  Neurological:     General: No focal deficit present.     Mental Status: He is alert  and oriented to person, place, and time.  Psychiatric:        Mood and Affect: Mood normal.        Behavior: Behavior normal.        Thought Content: Thought content normal.     Results for orders placed or performed during the hospital encounter of 06/12/19 (from the past 24 hour(s))  POC SARS Coronavirus 2 Ag-ED - Nasal Swab (BD Veritor Kit)     Status: Abnormal   Collection Time: 06/12/19  7:11 PM  Result Value Ref Range   SARS Coronavirus 2 Ag POSITIVE (A) NEGATIVE  POC SARS Coronavirus 2 Ag     Status: Abnormal   Collection Time: 06/12/19  7:11 PM  Result Value Ref Range   SARS Coronavirus 2 Ag POSITIVE (A) NEGATIVE    Assessment and Plan :   1. COVID-19   2. Body aches   3. Cough   4. Malaise and fatigue   5. Nasal congestion   6. HIV disease ([place])     Will manage COVID-19 with supportive care. Counseled patient on nature of COVID-19 including modes of transmission, diagnostic testing, management and supportive care.  Offered symptomatic relief. Counseled patient on potential for adverse effects with medications prescribed/recommended today.  Patient has HIV and is higher risk, reviewed strict ER and return-to-clinic precautions, patient verbalized understanding.     Jaynee Eagles, [place] 06/17/19 [phone]

## 2019-06-23 ENCOUNTER — Encounter (HOSPITAL_COMMUNITY): Payer: Self-pay | Admitting: Emergency Medicine

## 2019-06-23 ENCOUNTER — Ambulatory Visit (HOSPITAL_COMMUNITY)
Admission: EM | Admit: 2019-06-23 | Discharge: 2019-06-23 | Disposition: A | Discharge status: Home / Self Care | Payer: 59

## 2019-06-23 ENCOUNTER — Other Ambulatory Visit: Payer: Self-pay

## 2019-06-23 ENCOUNTER — Ambulatory Visit (INDEPENDENT_AMBULATORY_CARE_PROVIDER_SITE_OTHER): Payer: 59

## 2019-06-23 DIAGNOSIS — B20 Human immunodeficiency virus [HIV] disease: Secondary | ICD-10-CM

## 2019-06-23 DIAGNOSIS — R0781 Pleurodynia: Secondary | ICD-10-CM

## 2019-06-23 DIAGNOSIS — U071 COVID-19: Secondary | ICD-10-CM

## 2019-06-23 MED ORDER — BENZONATATE 200 MG PO CAPS: 200.0000 mg | ORAL_CAPSULE | Freq: Three times a day (TID) | ORAL | 0 refills | Status: AC | PRN

## 2019-06-23 MED ORDER — AZITHROMYCIN 250 MG PO TABS: 250.0000 mg | ORAL_TABLET | Freq: Every day | ORAL | 0 refills | Status: DC

## 2019-06-23 MED ORDER — NAPROXEN 500 MG PO TABS: 500.0000 mg | ORAL_TABLET | Freq: Two times a day (BID) | ORAL | 0 refills | Status: DC

## 2019-06-23 NOTE — ED Provider Notes (Signed)
[facility]    CSN: [id number] Arrival date & time: 06/23/19  0915      History   Chief Complaint Chief Complaint  Patient presents with  . Abdominal Pain    RUQ    HPI Keith Vasquez is a 38 y.o. male history of HIV, presenting today for evaluation of rib pain.  Patient tested positive for Covid on 12/10.  His symptoms had started around 12/7.  He has been quarantining for the past 1 days.  Overall he has had significant improvement of his symptoms including improved URI symptoms, improved cough and smell/taste has returned.  He is concerned as he has had some right lower rib/right upper abdominal pain.  He notices this most with taking a deep breath or certain positions.  Denies significant shortness of breath.  Will occasionally still have a cough when he does take a deep breath.  He denies any fall injury or trauma to this area.  Denies any nausea or vomiting.  Eating and drinking normally.  Denies worsening pain with oral intake.  Denies fevers.  Viral load has been undetectable and has been taking HIV medicines regularly.  Denies any leg pain or leg swelling.  Denies prior DVT/PE.  Denies tobacco use.  HPI  Past Medical History:  Diagnosis Date  . Allergy   . HIV infection [facility])     Patient Active Problem List   Diagnosis Date Noted  . HIV (human immunodeficiency virus infection) (HCC) 12/12/2007    History reviewed. No pertinent surgical history.     Home Medications    Prior to Admission medications   Medication Sig Start Date End Date Taking? Authorizing Provider  acetaminophen (TYLENOL) 500 MG tablet Take 1,500 mg by mouth every 6 (six) hours as needed.   Yes [provider]  emtricitabine-tenofovir (TRUVADA) 200-300 MG per tablet Take 1 tablet by mouth daily.   Yes [provider]  raltegravir (ISENTRESS) 400 MG tablet Take 400 mg by mouth 2 (two) times daily.   Yes [provider]  azithromycin (ZITHROMAX) 250 MG  tablet Take 1 tablet (250 mg total) by mouth daily. Take first 2 tablets together, then 1 every day until finished. 06/23/19   Shalom Mcguiness C, PA-C  benzonatate (TESSALON) 200 MG capsule Take 1 capsule (200 mg total) by mouth 3 (three) times daily as needed for up to 7 days for cough. 06/23/19 06/30/19  Sanuel Ladnier C, PA-C  naproxen (NAPROSYN) 500 MG tablet Take 1 tablet (500 mg total) by mouth 2 (two) times daily. 06/23/19   Marl Seago C, PA-C  promethazine-dextromethorphan (PROMETHAZINE-DM) 6.25-15 MG/5ML syrup Take 5 mLs by mouth at bedtime as needed for cough. 06/12/19   Wallis Bamberg, PA-C    Family History Family History  Problem Relation Age of Onset  . Hypertension Mother   . Hypertension Father     Social History Social History   Tobacco Use  . Smoking status: Never Smoker  . Smokeless tobacco: Never Used  Substance Use Topics  . Alcohol use: Yes    Alcohol/week: 4.0 - 6.0 standard drinks    Types: 4 - 6 Shots of liquor per week    Comment: occasionally  . Drug use: No     Allergies   Ciprofloxacin   Review of Systems Review of Systems  Constitutional: Negative for activity change, appetite change, chills, fatigue and fever.  HENT: Negative for congestion, ear pain, rhinorrhea, sinus pressure, sore throat and trouble swallowing.   Eyes: Negative  for discharge and redness.  Respiratory: Positive for cough. Negative for chest tightness and shortness of breath.   Cardiovascular: Positive for chest pain.  Gastrointestinal: Negative for abdominal pain, diarrhea, nausea and vomiting.  Musculoskeletal: Negative for myalgias.  Skin: Negative for rash.  Neurological: Negative for dizziness, light-headedness and headaches.     Physical Exam Triage Vital Signs ED Triage Vitals  Enc Vitals Group     BP 06/23/19 1050 107/76     Pulse Rate 06/23/19 1050 84     Resp 06/23/19 1050 18     Temp 06/23/19 1050 99 F (37.2 C)     Temp Source 06/23/19 1050 Oral      SpO2 06/23/19 1050 96 %     Weight --      Height --      Head Circumference --      Peak Flow --      Pain Score 06/23/19 1047 6     Pain Loc --      Pain Edu? --      Excl. in [place]? --    No data found.  Updated Vital Signs BP 107/76 (BP Location: Left Arm)   Pulse 84   Temp 99 F (37.2 C) (Oral)   Resp 18   SpO2 96%   Visual Acuity Right Eye Distance:   Left Eye Distance:   Bilateral Distance:    Right Eye Near:   Left Eye Near:    Bilateral Near:     Physical Exam Vitals and nursing note reviewed.  Constitutional:      Appearance: He is well-developed.     Comments: No acute distress  HENT:     Head: Normocephalic and atraumatic.     Ears:     Comments: Bilateral ears without tenderness to palpation of external auricle, tragus and mastoid, EAC's without erythema or swelling, TM's with good bony landmarks and cone of light. Non erythematous.    Nose: Nose normal.     Mouth/Throat:     Comments: Oral mucosa pink and moist, no tonsillar enlargement or exudate. Posterior pharynx patent and nonerythematous, no uvula deviation or swelling. Normal phonation. Eyes:     Conjunctiva/sclera: Conjunctivae normal.  Cardiovascular:     Rate and Rhythm: Normal rate and regular rhythm.     Heart sounds: No murmur.  Pulmonary:     Effort: Pulmonary effort is normal. No respiratory distress.     Breath sounds: Normal breath sounds.     Comments: Breathing comfortably at rest, CTABL, no wheezing, rales or other adventitious sounds auscultated Abdominal:     General: There is no distension.     Palpations: Abdomen is soft.     Tenderness: There is no abdominal tenderness.     Comments: Mild tenderness to palpation along right rib border and right upper quadrant, negative rebound, negative Murphy's  Musculoskeletal:        General: Normal range of motion.     Cervical back: Neck supple.     Comments: Tenderness to palpation over left anterior chest, most prominent around T4/T5  area and lower rib cage  Nontender over thoracic back on right  Skin:    General: Skin is warm and dry.  Neurological:     Mental Status: He is alert and oriented to person, place, and time.      UC Treatments / Results  Labs (all labs ordered are listed, but only abnormal results are displayed) Labs Reviewed - No data to display  EKG   Radiology DG Chest 2 View  Result Date: 06/23/2019 CLINICAL DATA:  Right side pain, COVID positive 11 days ago. EXAM: CHEST - 2 VIEW COMPARISON:  10/19/2010 FINDINGS: Low lung volumes. Airspace opacities noted in both lower lungs could reflect atelectasis or infiltrate/pneumonia. No significant effusions. No acute bony abnormality. Heart is normal size. IMPRESSION: Low lung volumes. Bilateral lower lobe atelectasis or consolidation. Electronically Signed   By: Charlett NoseKevin  Dover M.D.   On: 06/23/2019 11:43    Procedures Procedures (including critical care time)  Medications Ordered in UC Medications - No data to display  Initial Impression / Assessment and Plan / UC Course  I have reviewed the triage vital signs and the nursing notes.  Pertinent labs & imaging results that were available during my care of the patient were reviewed by me and considered in my medical decision making (see chart for details).     X-ray with possible bilateral lower lobe atelectasis versus consolidation.  Given recent history of Covid, likely could be from Covid pneumonia.  Given patient is HIV positive for Covid and also cover for atypicals with azithromycin and continue symptomatic and supportive care.  Naprosyn for discomfort as needed.  Continue to monitor,Discussed strict return precautions. Patient verbalized understanding and is agreeable with plan.  Final Clinical Impressions(s) / UC Diagnoses   Final diagnoses:  Rib pain on right side     Discharge Instructions     Xray did show possible pneumonia- begin azithromycin- two tablets today, 1 tablet for the  following 4 days Tessalon every 8 hours as needed for further cough Naprosyn twice daily for pain as needed use only for the next 1-2 weeks    ED Prescriptions    Medication Sig Dispense Auth. Provider   azithromycin (ZITHROMAX) 250 MG tablet Take 1 tablet (250 mg total) by mouth daily. Take first 2 tablets together, then 1 every day until finished. 6 tablet Dashonna Chagnon C, PA-C   benzonatate (TESSALON) 200 MG capsule Take 1 capsule (200 mg total) by mouth 3 (three) times daily as needed for up to 7 days for cough. 28 capsule Zavier Canela C, PA-C   naproxen (NAPROSYN) 500 MG tablet Take 1 tablet (500 mg total) by mouth 2 (two) times daily. 30 tablet Meliyah Simon, [place] C, PA-C     PDMP not reviewed this encounter.   Lew DawesWieters, Bronda Alfred C, [place]PA-C 06/23/19 1156

## 2019-06-23 NOTE — Discharge Instructions (Signed)
Xray did show possible pneumonia- begin azithromycin- two tablets today, 1 tablet for the following 4 days Tessalon every 8 hours as needed for further cough Naprosyn twice daily for pain as needed use only for the next 1-2 weeks

## 2019-06-23 NOTE — ED Triage Notes (Addendum)
Pt was diagnosed with Covid on December 10.  Pt states his first symptoms were 3 days before that.  Pt states he feels better now, but he has a pain in his RUQ that he thinks is from coughing so much.  Pt reports taking 1500 mg of tylenol at 0830.  Pt was educated on not taking that much tylenol at one time.  1000 mg is the maximum dosage at one time.

## 2019-11-13 ENCOUNTER — Other Ambulatory Visit: Payer: Self-pay

## 2019-11-13 ENCOUNTER — Ambulatory Visit (HOSPITAL_COMMUNITY)
Admission: EM | Admit: 2019-11-13 | Discharge: 2019-11-13 | Disposition: A | Discharge status: Home / Self Care | Payer: 59 | Attending: Urgent Care | Admitting: Urgent Care

## 2019-11-13 ENCOUNTER — Encounter (HOSPITAL_COMMUNITY): Payer: Self-pay

## 2019-11-13 DIAGNOSIS — B20 Human immunodeficiency virus [HIV] disease: Secondary | ICD-10-CM

## 2019-11-13 DIAGNOSIS — R0789 Other chest pain: Secondary | ICD-10-CM

## 2019-11-13 MED ORDER — OMEPRAZOLE 20 MG PO CPDR: 20.0000 mg | DELAYED_RELEASE_CAPSULE | Freq: Every day | ORAL | 0 refills | Status: DC

## 2019-11-13 MED ORDER — FAMOTIDINE 20 MG PO TABS: 20.0000 mg | ORAL_TABLET | Freq: Two times a day (BID) | ORAL | 0 refills | Status: AC

## 2019-11-13 NOTE — ED Triage Notes (Signed)
Patient reports intermittent chest pain x10 days. Reports it is typically occurs after he eats or gets upset or receives bad news. Also reports he slept wrong on his left arm last night and it feels tingly.

## 2019-11-13 NOTE — Discharge Instructions (Signed)
Start taking Pepcid (famotidine) for short term relief of what I suspect is acid reflux. This medication is to be taken twice daily and stops working after about 4-6 weeks if you use it every day. It will be important to use this medication while Prilosec gets into your system and helps you more long term. This kind of medication takes about 2-3 weeks to really start working. In the meantime, review the information below regarding GERD.   For diabetes or elevated blood sugar, please make sure you are avoiding starchy, carbohydrate foods like pasta, breads, pastry, rice, potatoes, desserts. These foods can elevated your blood sugar. Also, avoid sodas, sweet teas, sugary beverages, fruit juices.  Drinking plain water will be much more helpful, try 64 ounces of water daily.  It is okay to flavor your water naturally by cutting cucumber or lemon and mint or lime, placing it in a picture with water and drinking it over a period of 2 to 3 days as long as it remains refrigerated.    For elevated blood pressure, make sure you are monitoring salt in your diet.  Do not eat restaurant foods and limit processed foods at home, prepare/cook your own foods at home.  Processed foods include things like frozen meals preseasoned meats and dinners, deli meats, canned foods as they are high in sodium/salt.  Make sure your pain attention to sodium labels on foods you by at the grocery store.  For seasoning you can use a brand called Mrs. Dash which includes a lot of salt free seasonings.  Salads - kale, spinach, cabbage, spring mix; use seeds like pumpkin seeds or sunflower seeds, almonds, walnuts or pecans; you can also use 1-2 hard boiled eggs in your salads Fruits - avocadoes, berries (blueberries, raspberries, blackberries), apples, oranges Vegetables - aspargus, cauliflower, broccoli, green beans, brussel spouts, bell peppers; stay away from starchy vegetables like potatoes, carrots, peas  Regarding meat it is better to  eat lean meats and limit your red meat consumption including pork.  Wild caught fish, chicken breast are good options.  DO NOT EAT ANY FOODS ON THIS LIST THAT YOU ARE ALLERGIC TO.

## 2019-11-13 NOTE — ED Provider Notes (Signed)
[place]   MRN: [id number] DOB: 1980-12-01  Subjective:   Keith Vasquez is a 39 y.o. male with pmh HIV presenting for 1.5 week hx of intermittent mid-left side chest pain worse after eating or getting upset/bad news. Recently had a cousin that he was close to die, also had a co-worker's son die unexpectedly. Felt like this bad news triggered his chest pain. 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 Has not tried medications for relief. Gets regular f/u with ID, is doing very well with Truvada, Isentress. Denies hx of cancer, clots, lung disease. Denies hx of smoking. Patient eats very poorly by his own admission, eats fried foods, a lot of pork, hamburgers. Does try to eat fiber as well.   No current facility-administered medications for this encounter.  Current Outpatient Medications:  .  emtricitabine-tenofovir (TRUVADA) 200-300 MG per tablet, Take 1 tablet by mouth daily., Disp: , Rfl:  .  raltegravir (ISENTRESS) 400 MG tablet, Take 400 mg by mouth 2 (two) times daily., Disp: , Rfl:    Allergies  Allergen Reactions  . Ciprofloxacin Nausea And Vomiting    Past Medical History:  Diagnosis Date  . Allergy   . HIV infection ([place])      History reviewed. No pertinent surgical history.  Family History  Problem Relation Age of Onset  . Hypertension Mother   . Hypertension Father     Social History   Tobacco Use  . Smoking status: Never Smoker  . Smokeless tobacco: Never Used  Substance Use Topics  . Alcohol use: Yes    Alcohol/week: 4.0 - 6.0 standard drinks    Types: 4 - 6 Shots of liquor per week    Comment: occasionally  . Drug use: No    ROS   Objective:   Vitals: BP (!) 146/98 (BP Location: Right Arm)   Pulse 87   Temp 98.2 F (36.8 C) (Oral)   Resp 20   SpO2 100%   Physical Exam Constitutional:      General: He is not in acute distress.    Appearance: Normal appearance. He is  well-developed. He is not ill-appearing, toxic-appearing or diaphoretic.  HENT:     Head: Normocephalic and atraumatic.     Right Ear: External ear normal.     Left Ear: External ear normal.     Nose: Nose normal.     Mouth/Throat:     Mouth: Mucous membranes are moist.     Pharynx: Oropharynx is clear.  Eyes:     General: No scleral icterus.    Extraocular Movements: Extraocular movements intact.     Pupils: Pupils are equal, round, and reactive to light.  Cardiovascular:     Rate and Rhythm: Normal rate and regular rhythm.     Heart sounds: Normal heart sounds. No murmur. No friction rub. No gallop.   Pulmonary:     Effort: Pulmonary effort is normal. No respiratory distress.     Breath sounds: Normal breath sounds. No stridor. No wheezing, rhonchi or rales.  Abdominal:     General: Bowel sounds are normal. There is no distension.     Palpations: Abdomen is soft. There is no mass.     Tenderness: There is no abdominal tenderness. There is no guarding or rebound.  Skin:    General: Skin is warm and dry.  Neurological:     Mental Status: He is alert and oriented to person, place,  and time.  Psychiatric:        Mood and Affect: Mood normal.        Behavior: Behavior normal.        Thought Content: Thought content normal.     ED ECG REPORT   Date: 11/13/2019  Rate: 81bpm  Rhythm: normal sinus rhythm  QRS Axis: normal  Intervals: normal  ST/T Wave abnormalities: normal  Conduction Disutrbances:none  Narrative Interpretation: Sinus rhythm at 81 bpm, very comparable to previous EKG from 07/22/2013.  Old EKG Reviewed: unchanged  I have personally reviewed the EKG tracing and agree with the computerized printout as noted.   Assessment and Plan :   PDMP not reviewed this encounter.  1. Atypical chest pain   2. HIV disease (HCC)     Start Prilosec for maintenance and Pepcid for more immediate relief. Counseled on dosing, dietary modifications.  EKG reassuring and  without changes, lung sounds are clear, no respiratory symptoms and vital signs stable for discharge.  Counseled patient on potential for adverse effects with medications prescribed/recommended today, ER and return-to-clinic precautions discussed, patient verbalized understanding.    Wallis Bamberg, PA-C 11/13/19 2026

## 2020-06-24 ENCOUNTER — Other Ambulatory Visit: Payer: Self-pay

## 2020-06-24 ENCOUNTER — Emergency Department (HOSPITAL_COMMUNITY)
Admission: EM | Admit: 2020-06-24 | Discharge: 2020-06-24 | Disposition: A | Discharge status: Home / Self Care | Payer: 59 | Attending: Emergency Medicine | Admitting: Emergency Medicine

## 2020-06-24 ENCOUNTER — Emergency Department (HOSPITAL_COMMUNITY): Payer: 59

## 2020-06-24 DIAGNOSIS — F419 Anxiety disorder, unspecified: Secondary | ICD-10-CM | POA: Diagnosis not present

## 2020-06-24 DIAGNOSIS — R079 Chest pain, unspecified: Secondary | ICD-10-CM | POA: Insufficient documentation

## 2020-06-24 DIAGNOSIS — Z5321 Procedure and treatment not carried out due to patient leaving prior to being seen by health care provider: Secondary | ICD-10-CM | POA: Insufficient documentation

## 2020-06-24 DIAGNOSIS — R0602 Shortness of breath: Secondary | ICD-10-CM | POA: Diagnosis not present

## 2020-06-24 LAB — BASIC METABOLIC PANEL
Anion gap: 15 (ref 5–15)
BUN: 20 mg/dL (ref 6–20)
CO2: 18 mmol/L — ABNORMAL LOW (ref 22–32)
Calcium: 9.6 mg/dL (ref 8.9–10.3)
Chloride: 107 mmol/L (ref 98–111)
Creatinine, Ser: 1.54 mg/dL — ABNORMAL HIGH (ref 0.61–1.24)
GFR, Estimated: 59 mL/min — ABNORMAL LOW (ref >60)
Glucose, Bld: 218 mg/dL — ABNORMAL HIGH (ref 70–99)
Potassium: 3.2 mmol/L — ABNORMAL LOW (ref 3.5–5.1)
Sodium: 140 mmol/L (ref 135–145)

## 2020-06-24 LAB — CBC
HCT: 46.9 % (ref 39.0–52.0)
Hemoglobin: 15.1 g/dL (ref 13.0–17.0)
MCH: 29.3 pg (ref 26.0–34.0)
MCHC: 32.2 g/dL (ref 30.0–36.0)
MCV: 90.9 fL (ref 80.0–100.0)
Platelets: 178 10*3/uL (ref 150–400)
RBC: 5.16 MIL/uL (ref 4.22–5.81)
RDW: 13.4 % (ref 11.5–15.5)
WBC: 11.3 10*3/uL — ABNORMAL HIGH (ref 4.0–10.5)
nRBC: 0 % (ref 0.0–0.2)

## 2020-06-24 LAB — TROPONIN I (HIGH SENSITIVITY)
Troponin I (High Sensitivity): 4 ng/L (ref <18)
Troponin I (High Sensitivity): 4 ng/L (ref <18)

## 2020-06-24 NOTE — ED Triage Notes (Addendum)
Pt presents to ED BIB [facility]. Pt c/o mid cp, nonradiating and SOB. Pt report this happens something w/ anxiety. EMS given 4mg  zofran, pt took home 324 asa. EMS VSS, ST - 120's

## 2020-06-24 NOTE — ED Notes (Signed)
Pt left due to not being seen quick enough 

## 2020-07-15 IMAGING — DX DG CHEST 2V
2 series · 2 of 2 positions shown · non-contrast
Comparison: 10/19/2010

CLINICAL DATA: Right side pain, COVID positive 11 days ago.

EXAM:
CHEST - 2 VIEW

[chest pa]
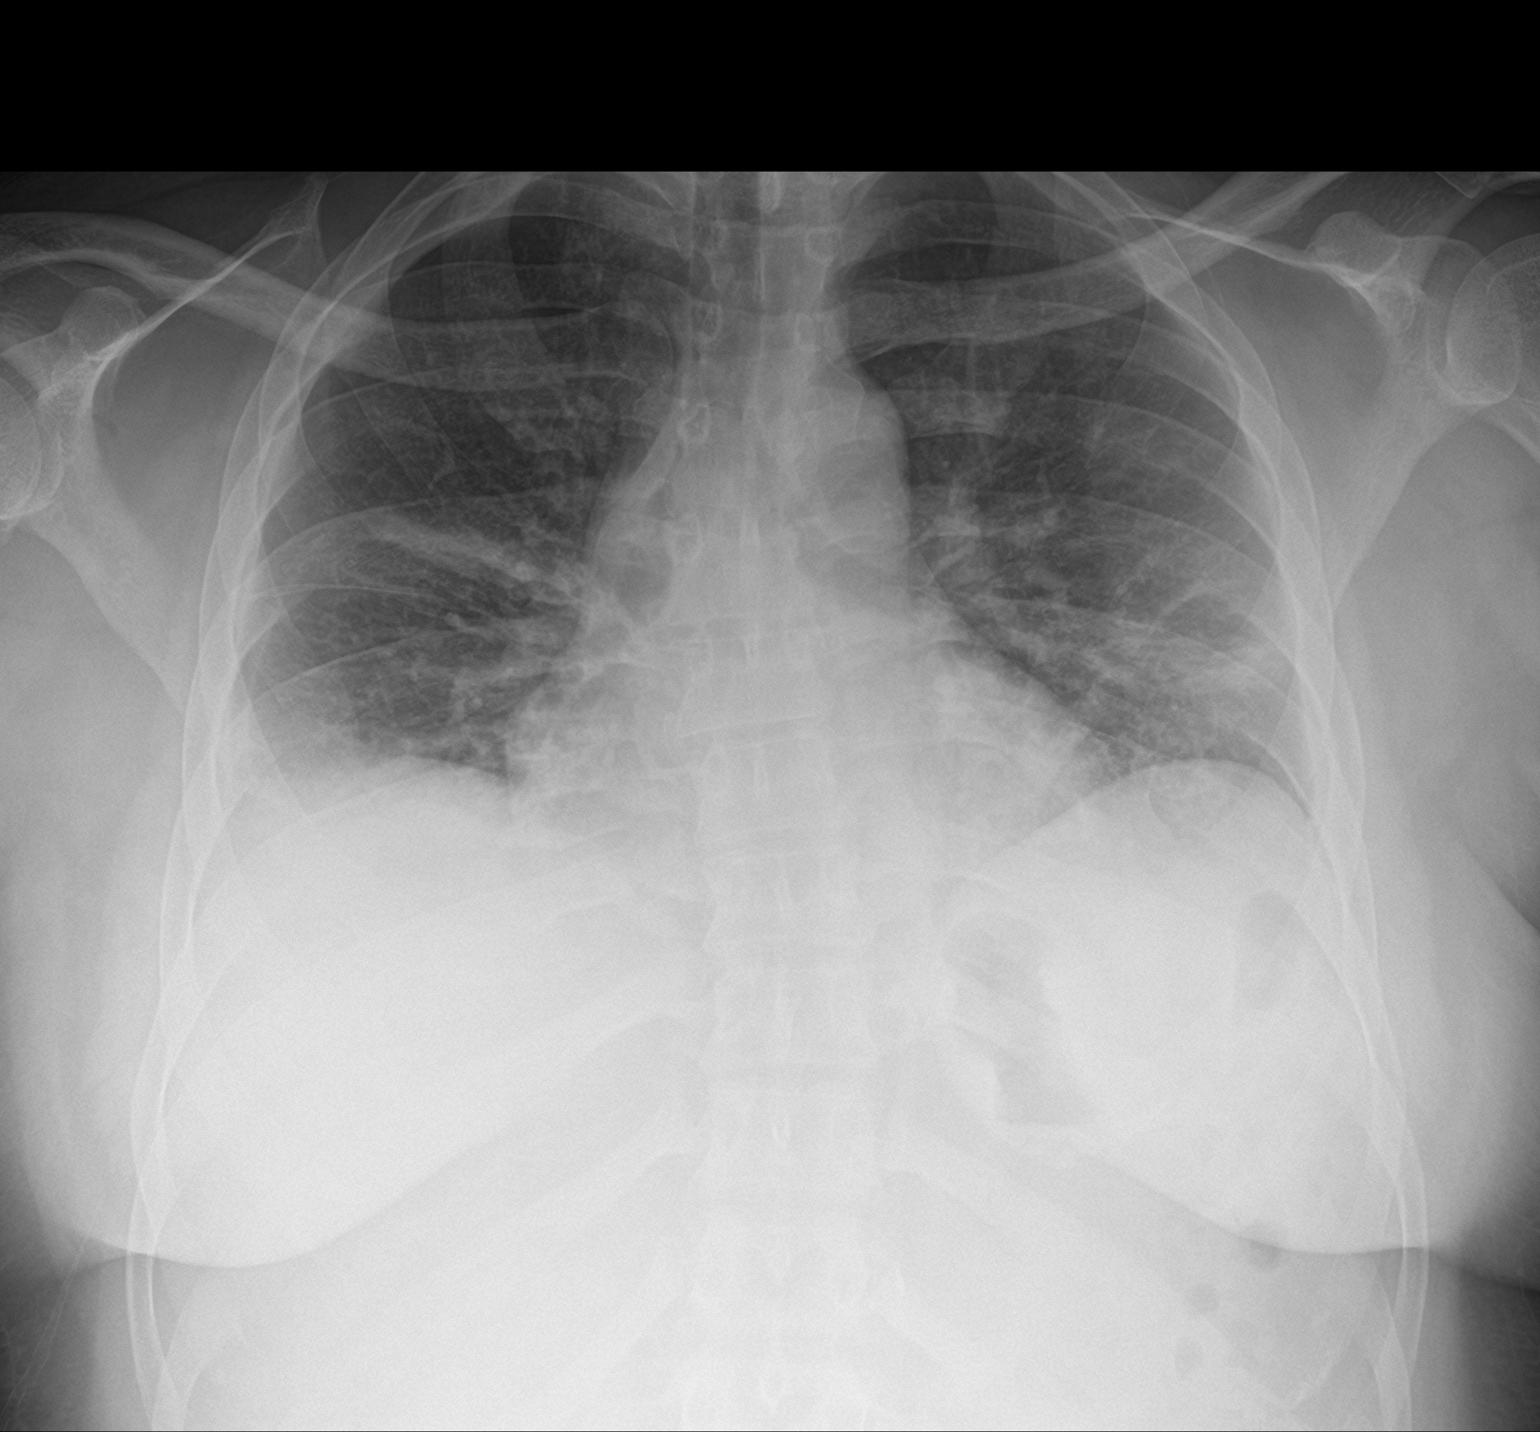

[chest lat]
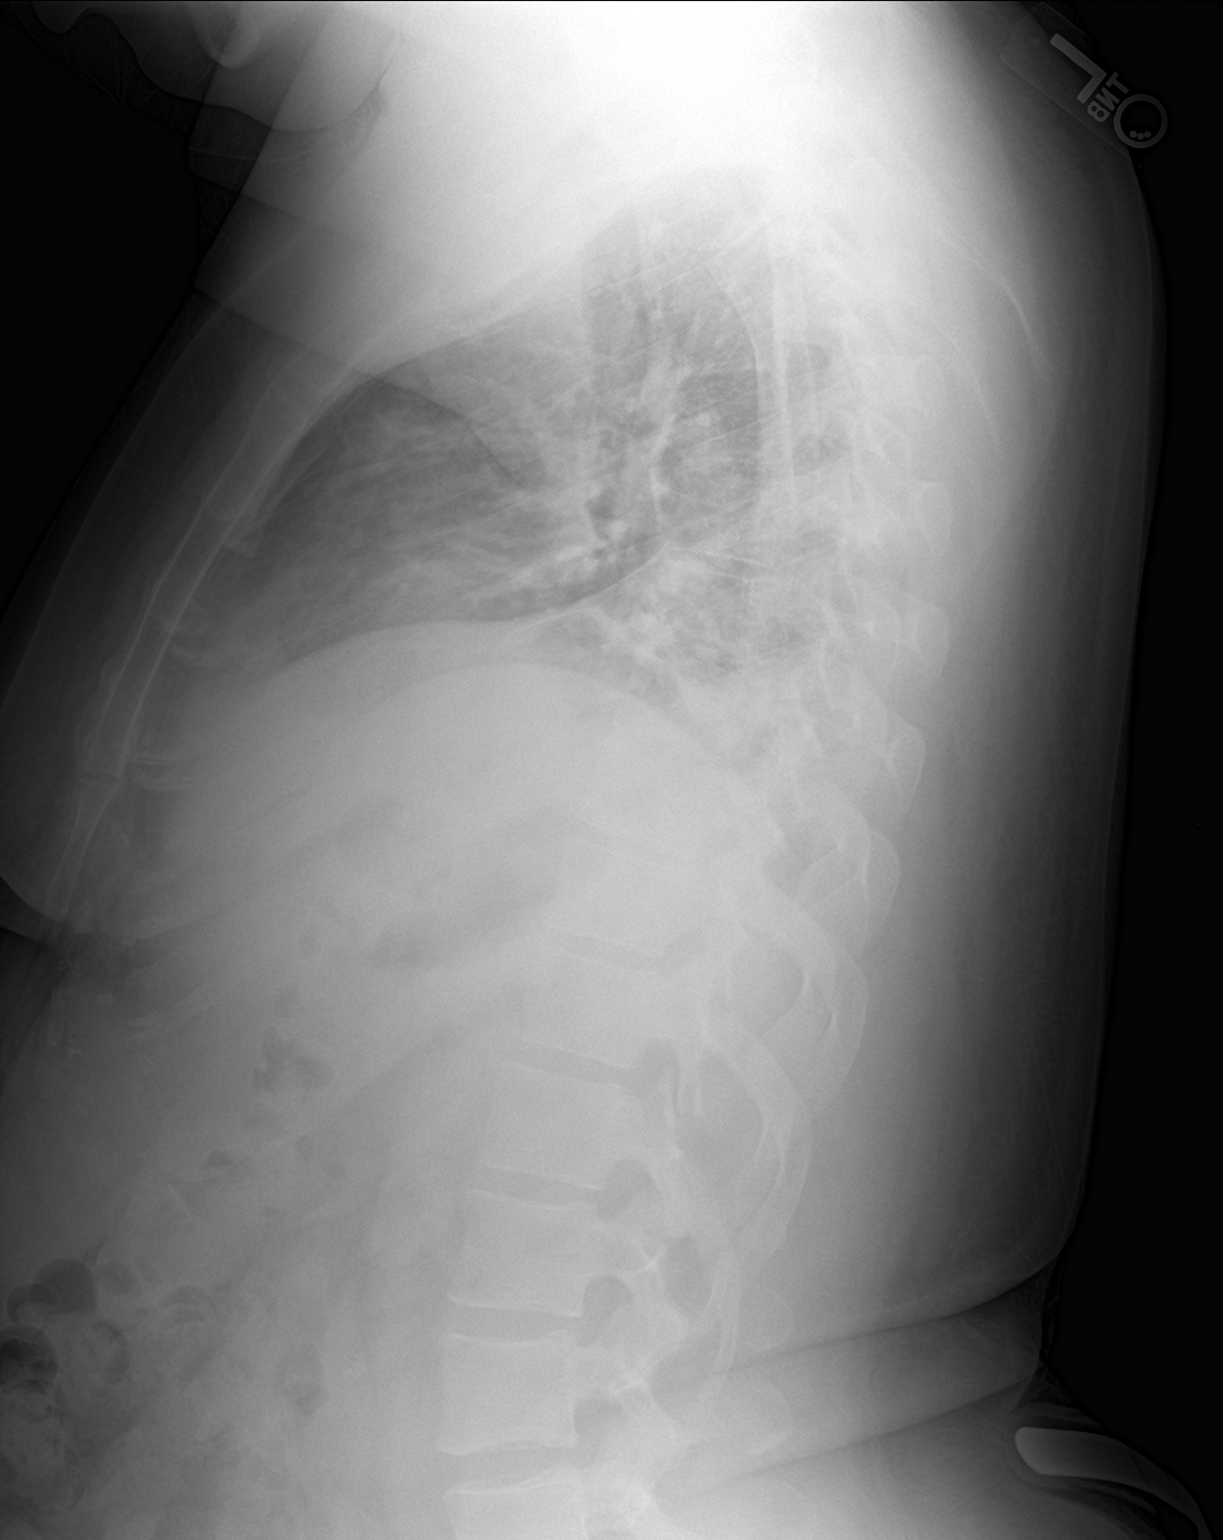

[2 of 2 positions shown; findings below may reference images not displayed]

FINDINGS: Low lung volumes. Airspace opacities noted in both lower lungs could
reflect atelectasis or infiltrate/pneumonia. No significant
effusions. No acute bony abnormality. Heart is normal size.
IMPRESSION: Low lung volumes. Bilateral lower lobe atelectasis or consolidation.

## 2021-07-20 ENCOUNTER — Encounter (HOSPITAL_COMMUNITY): Payer: Self-pay

## 2021-07-20 ENCOUNTER — Ambulatory Visit (HOSPITAL_COMMUNITY)
Admission: EM | Admit: 2021-07-20 | Discharge: 2021-07-20 | Disposition: A | Discharge status: Home / Self Care | Payer: 59

## 2021-07-20 ENCOUNTER — Other Ambulatory Visit: Payer: Self-pay

## 2021-07-20 DIAGNOSIS — M79602 Pain in left arm: Secondary | ICD-10-CM

## 2021-07-20 NOTE — Discharge Instructions (Signed)
Please go get ultrasound tomorrow.  Use over-the-counter medications including Mobic for pain relief.  If symptoms not improving and you are ultrasound negative please follow-up with sports medicine.  If anything worsens please go to the emergency room.

## 2021-07-20 NOTE — ED Triage Notes (Addendum)
Pt c/o lt arm pain off and on x1wk, denies injury. States on Sunday and Monday night work up in a panic feeling and chest pain. States was given meloxicam from tele doc with some relief.

## 2021-07-20 NOTE — ED Provider Notes (Addendum)
[facility]    CSN: [id number] Arrival date & time: 07/20/21  1742      History   Chief Complaint Chief Complaint  Patient presents with   Arm Pain    Chest pain    HPI Keith Vasquez is a 41 y.o. male.   Patient presents today with a weeklong history of intermittent left medial arm pain.  He reports having similar episodes since having COVID approximately 1 year ago but they have worsened and become more bothersome over the past week.  He at one point did have associated chest pain but believes this was related to anxiety and this is since resolved.  He did telemedicine visit and was given Mobic which provided improvement but not resolution of symptoms.  He denies any known injury or increase in activity prior to symptom onset.  He reports pain is worse at rest and he does not notice it much during the day but will notice it when he goes to sleep at night.  Pain is rated 6/7 on a 0-10 pain scale, localized to medial left arm, described as throbbing, no aggravating relieving factors identified.  He denies any lightheadedness, dizziness, chest pain, shortness of breath.  He denies any recent COVID exposure or illness, hospitalization, recent travel, exogenous hormone use, history of malignancy.  He denies history of VTE event.   Past Medical History:  Diagnosis Date   Allergy    HIV infection [facility])     Patient Active Problem List   Diagnosis Date Noted   HIV (human immunodeficiency virus infection) (HCC) 12/12/2007    History reviewed. No pertinent surgical history.     Home Medications    Prior to Admission medications   Medication Sig Start Date End Date Taking? Authorizing Provider  emtricitabine-tenofovir (TRUVADA) 200-300 MG per tablet Take 1 tablet by mouth daily.    [provider]  famotidine (PEPCID) 20 MG tablet Take 1 tablet (20 mg total) by mouth 2 (two) times daily. 11/13/19   Wallis Bamberg, PA-C  meloxicam (MOBIC) 7.5 MG tablet Take 7.5  mg by mouth 2 (two) times daily. 07/15/21   [provider]  omeprazole (PRILOSEC) 20 MG capsule Take 1 capsule (20 mg total) by mouth daily. 11/13/19   Wallis Bamberg, PA-C  raltegravir (ISENTRESS) 400 MG tablet Take 400 mg by mouth 2 (two) times daily.    [provider]    Family History Family History  Problem Relation Age of Onset   Hypertension Mother    Hypertension Father     Social History Social History   Tobacco Use   Smoking status: Never   Smokeless tobacco: Never  Substance Use Topics   Alcohol use: Yes    Alcohol/week: 4.0 - 6.0 standard drinks    Types: 4 - 6 Shots of liquor per week    Comment: occasionally   Drug use: No     Allergies   Ciprofloxacin   Review of Systems Review of Systems  Constitutional:  Positive for activity change. Negative for appetite change, fatigue and fever.  Respiratory:  Negative for cough and shortness of breath.   Cardiovascular:  Negative for chest pain.  Gastrointestinal:  Negative for abdominal pain, diarrhea, nausea and vomiting.  Musculoskeletal:  Positive for myalgias. Negative for arthralgias.  Neurological:  Negative for dizziness, light-headedness and headaches.    Physical Exam Triage Vital Signs ED Triage Vitals  Enc Vitals Group     BP 07/20/21 1918 127/86  Pulse Rate 07/20/21 1918 76     Resp 07/20/21 1918 18     Temp 07/20/21 1918 98.1 F (36.7 C)     Temp Source 07/20/21 1918 Oral     SpO2 07/20/21 1918 97 %     Weight --      Height --      Head Circumference --      Peak Flow --      Pain Score 07/20/21 1919 2     Pain Loc --      Pain Edu? --      Excl. in GC? --    No data found.  Updated Vital Signs BP 127/86 (BP Location: Left Arm)    Pulse 76    Temp 98.1 F (36.7 C) (Oral)    Resp 18    SpO2 97%   Visual Acuity Right Eye Distance:   Left Eye Distance:   Bilateral Distance:    Right Eye Near:   Left Eye Near:    Bilateral Near:     Physical Exam Vitals  reviewed.  Constitutional:      General: He is awake.     Appearance: Normal appearance. He is well-developed. He is not ill-appearing.     Comments: Very pleasant male appears stated age in no acute distress sitting comfortably in exam room  HENT:     Head: Normocephalic and atraumatic.     Mouth/Throat:     Pharynx: No oropharyngeal exudate, posterior oropharyngeal erythema or uvula swelling.  Cardiovascular:     Rate and Rhythm: Normal rate and regular rhythm.     Pulses:          Radial pulses are 2+ on the right side and 2+ on the left side.     Heart sounds: Normal heart sounds, S1 normal and S2 normal. No murmur heard. Pulmonary:     Effort: Pulmonary effort is normal.     Breath sounds: Normal breath sounds. No stridor. No wheezing, rhonchi or rales.     Comments: Clear to auscultation bilaterally Musculoskeletal:     Left shoulder: No swelling, laceration, tenderness or bony tenderness. Normal range of motion.     Left upper arm: Tenderness present. No swelling, deformity or bony tenderness.     Comments: Left shoulder: No bony tenderness on exam.  Normal active range of motion.  Strength 5/5.  Hand neurovascularly intact.  Pain reproducible with palpation over medial upper arm.  No deformity noted.  No significant swelling.  Neurological:     Mental Status: He is alert.  Psychiatric:        Behavior: Behavior is cooperative.     UC Treatments / Results  Labs (all labs ordered are listed, but only abnormal results are displayed) Labs Reviewed - No data to display  EKG   Radiology No results found.  Procedures Procedures (including critical care time)  Medications Ordered in UC Medications - No data to display  Initial Impression / Assessment and Plan / UC Course  I have reviewed the triage vital signs and the nursing notes.  Pertinent labs & imaging results that were available during my care of the patient were reviewed by me and considered in my medical  decision making (see chart for details).     EKG was obtained that showed normal sinus rhythm with no ischemic changes and ventricular rate is 77 bpm; compared to 06/28/2020 tracing normal sinus rhythm replaces sinus tachycardia.  Suspect musculoskeletal etiology given clinical presentation but  given ongoing pain not responding to NSAIDs we will order venous ultrasound to rule out DVT given unilateral distribution and throbbing descriptor of pain.  No indication for emergent evaluation as patient is well-appearing, nontachycardic, with oxygen saturation of 97%.  Outpatient ultrasound was ordered for tomorrow but discussed that if he has any worsening symptoms overnight including increased pain, swelling, chest pain, shortness of breath, palpitations he needs to go to the emergency room.  He will continue previously prescribed Mobic and recommended that he avoid strenuous activity.  Discussed that if ultrasound is negative and he continues to have symptoms he should follow-up with sports medicine was given contact information for local provider.  He was provided work excuse note.  Discussed alarm symptoms that warrant emergent evaluation.  Strict return precautions given to which he expressed understanding.  Final Clinical Impressions(s) / UC Diagnoses   Final diagnoses:  Left arm pain     Discharge Instructions      Please go get ultrasound tomorrow.  Use over-the-counter medications including Mobic for pain relief.  If symptoms not improving and you are ultrasound negative please follow-up with sports medicine.  If anything worsens please go to the emergency room.     ED Prescriptions   None    PDMP not reviewed this encounter.   Jeani HawkingRaspet, Huda Petrey K, PA-C 07/20/21 2048    Lashanta Elbe, Noberto RetortErin K, PA-C 07/20/21 2050

## 2021-07-21 ENCOUNTER — Ambulatory Visit (HOSPITAL_COMMUNITY)
Admission: RE | Admit: 2021-07-21 | Discharge: 2021-07-21 | Disposition: A | Discharge status: Home / Self Care | Payer: 59 | Source: Ambulatory Visit | Attending: Physician Assistant | Admitting: Physician Assistant

## 2021-07-21 DIAGNOSIS — M79602 Pain in left arm: Secondary | ICD-10-CM | POA: Diagnosis not present

## 2021-07-21 NOTE — Progress Notes (Signed)
Upper extremity venous has been completed.   Preliminary results in CV Proc.   Keith Vasquez 07/21/2021 11:22 AM

## 2021-09-01 ENCOUNTER — Ambulatory Visit
Admission: RE | Admit: 2021-09-01 | Discharge: 2021-09-01 | Disposition: A | Discharge status: Home / Self Care | Payer: 59 | Source: Ambulatory Visit

## 2021-09-01 ENCOUNTER — Other Ambulatory Visit: Payer: Self-pay

## 2021-09-01 VITALS — BP 112/76 | HR 82 | Temp 97.9°F | Resp 18 | Ht 67.0 in | Wt 240.1 lb

## 2021-09-01 DIAGNOSIS — R1012 Left upper quadrant pain: Secondary | ICD-10-CM

## 2021-09-01 MED ORDER — OMEPRAZOLE 20 MG PO CPDR: 20.0000 mg | DELAYED_RELEASE_CAPSULE | Freq: Every day | ORAL | 0 refills | Status: AC

## 2021-09-01 NOTE — Discharge Instructions (Signed)
Your symptoms may be related to reflux.  You have been prescribed omeprazole to take daily for at least 2 weeks.  Please go to the hospital if symptoms persist or worsen. ?

## 2021-09-01 NOTE — ED Triage Notes (Signed)
Patient c/o left sided breast pain x 1 week.  The pain is under his left breast, no apparent injury, no bruising to the area, no SOB, worse with laying down. ?

## 2021-09-01 NOTE — ED Provider Notes (Signed)
?[name] ? ? ? ?CSN: [id number] ?Arrival date & time: 09/01/21  1728 ? ? ?  ? ?History   ?Chief Complaint ?Chief Complaint  ?Patient presents with  ? Appointment  ?  Left Sided Breast Pain  ? ? ?HPI ?Keith Vasquez is a 41 y.o. male.  ? ?Patient presents with left upper abdominal pain that has been intermittent over the past week.  Denies any obvious injury to the area.  Pain is described as a cramping sensation.  Pain is worsened with movement and when laying on that side.  Denies history of the same.  Pain is intermittent and when it occurs is rated 10/10 on pain scale.  Denies nausea, vomiting, diarrhea, abdominal pain, blood in stool.  Having regular bowel movements.  Patient has not taken any medications to help alleviate pain. ? ? ? ?Past Medical History:  ?Diagnosis Date  ? Allergy   ? HIV infection (HCC)   ? ? ?Patient Active Problem List  ? Diagnosis Date Noted  ? HIV (human immunodeficiency virus infection) (HCC) 12/12/2007  ? ? ?History reviewed. No pertinent surgical history. ? ? ? ? ?Home Medications   ? ?Prior to Admission medications   ?Medication Sig Start Date End Date Taking? Authorizing Provider  ?emtricitabine-tenofovir (TRUVADA) 200-300 MG tablet Take 1 tablet by mouth daily.   Yes [provider]  ?famotidine (PEPCID) 20 MG tablet Take 1 tablet (20 mg total) by mouth 2 (two) times daily. 11/13/19  Yes Wallis Bamberg, PA-C  ?meloxicam (MOBIC) 7.5 MG tablet Take 7.5 mg by mouth 2 (two) times daily. 07/15/21  Yes [provider]  ?metFORMIN (GLUCOPHAGE-XR) 500 MG 24 hr tablet Take 1 tablet by mouth 2 (two) times daily. 10/19/20  Yes [provider]  ?MOUNJARO 2.5 MG/0.5ML Pen SMARTSIG:2.5 Milligram(s) SUB-Q Once a Week 08/22/21  Yes [provider]  ?raltegravir (ISENTRESS) 400 MG tablet Take 400 mg by mouth 2 (two) times daily.   Yes [provider]  ?omeprazole (PRILOSEC) 20 MG capsule Take 1 capsule (20 mg total) by mouth daily. 09/01/21    [name], FNP  ? ? ?Family History ?Family History  ?Problem Relation Age of Onset  ? Hypertension Mother   ? Hypertension Father   ? ? ?Social History ?Social History  ? ?Tobacco Use  ? Smoking status: Never  ? Smokeless tobacco: Never  ?Substance Use Topics  ? Alcohol use: Yes  ?  Alcohol/week: 4.0 - 6.0 standard drinks  ?  Types: 4 - 6 Shots of liquor per week  ?  Comment: occasionally  ? Drug use: No  ? ? ? ?Allergies   ?Ciprofloxacin ? ? ?Review of Systems ?Review of Systems ?Per HPI ? ?Physical Exam ?Triage Vital Signs ?ED Triage Vitals [09/01/21 1740]  ?Enc Vitals Group  ?   BP 112/76  ?   Pulse Rate 82  ?   Resp 18  ?   Temp 97.9 ?F (36.6 ?C)  ?   Temp Source Oral  ?   SpO2 97 %  ?   Weight 240 lb 1.3 oz (108.9 kg)  ?   Height 5\' 7"  (1.702 m)  ?   Head Circumference   ?   Peak Flow   ?   Pain Score 4  ?   Pain Loc   ?   Pain Edu?   ?   Excl. in GC?   ? ?No data found. ? ?Updated Vital Signs ?BP 112/76 (BP Location: Left  Arm)   Pulse 82   Temp 97.9 ?F (36.6 ?C) (Oral)   Resp 18   Ht 5\' 7"  (1.702 m)   Wt 240 lb 1.3 oz (108.9 kg)   SpO2 97%   BMI 37.60 kg/m?  ? ?Visual Acuity ?Right Eye Distance:   ?Left Eye Distance:   ?Bilateral Distance:   ? ?Right Eye Near:   ?Left Eye Near:    ?Bilateral Near:    ? ?Physical Exam ?Constitutional:   ?   General: He is not in acute distress. ?   Appearance: Normal appearance. He is not toxic-appearing or diaphoretic.  ?HENT:  ?   Head: Normocephalic and atraumatic.  ?Eyes:  ?   Extraocular Movements: Extraocular movements intact.  ?   Conjunctiva/sclera: Conjunctivae normal.  ?Cardiovascular:  ?   Rate and Rhythm: Normal rate and regular rhythm.  ?   Pulses: Normal pulses.  ?   Heart sounds: Normal heart sounds.  ?Pulmonary:  ?   Effort: Pulmonary effort is normal. No respiratory distress.  ?   Breath sounds: Normal breath sounds.  ?Chest:  ?   Chest wall: No tenderness.  ?Abdominal:  ?   General: Bowel sounds are normal.  ?   Palpations: Abdomen is soft.  ?    Tenderness: There is abdominal tenderness in the left upper quadrant.  ?Neurological:  ?   General: No focal deficit present.  ?   Mental Status: He is alert and oriented to person, place, and time. Mental status is at baseline.  ?Psychiatric:     ?   Mood and Affect: Mood normal.     ?   Behavior: Behavior normal.     ?   Thought Content: Thought content normal.     ?   Judgment: Judgment normal.  ? ? ? ?UC Treatments / Results  ?Labs ?(all labs ordered are listed, but only abnormal results are displayed) ?Labs Reviewed - No data to display ? ?EKG ? ? ?Radiology ?No results found. ? ?Procedures ?Procedures (including critical care time) ? ?Medications Ordered in UC ?Medications - No data to display ? ?Initial Impression / Assessment and Plan / UC Course  ?I have reviewed the triage vital signs and the nursing notes. ? ?Pertinent labs & imaging results that were available during my care of the patient were reviewed by me and considered in my medical decision making (see chart for details). ? ?  ? ?Patient having pain in left upper quadrant where stomach is located.  Patient has history of indigestion so I do suspect this could be related to reflux.  Due to severity of pain, patient was advised that it may be beneficial to go to the hospital for further evaluation and management  as he may need imaging but patient declined.  Risks associated with not going to hospital were discussed with patient.  Patient voiced understanding.  Will prescribe omeprazole to take as needed as patient has previously taken this before with tolerance.  Discussed strict return and ER precautions.  Patient verbalized understanding and was agreeable with plan. ?Final Clinical Impressions(s) / UC Diagnoses  ? ?Final diagnoses:  ?Abdominal pain, left upper quadrant  ? ? ? ?Discharge Instructions   ? ?  ?Your symptoms may be related to reflux.  You have been prescribed omeprazole to take daily for at least 2 weeks.  Please go to the hospital if  symptoms persist or worsen. ? ? ? ?ED Prescriptions   ? ? Medication Sig Dispense Auth.  Provider  ? omeprazole (PRILOSEC) 20 MG capsule Take 1 capsule (20 mg total) by mouth daily. 15 capsule Ervin Knack E, [place]  ? ?  ? ?PDMP not reviewed this encounter. ?  ?[name], [place] ?09/01/21 1802 ? ?
